# Patient Record
Sex: Female | Born: 1980 | Race: White | Hispanic: No | Marital: Married | Smoking: Former smoker
Health system: Southern US, Community
[De-identification: ages and names within clinical notes are randomized; demographics above are authoritative.]

## PROBLEM LIST (undated history)

## (undated) DIAGNOSIS — O24419 Gestational diabetes mellitus in pregnancy, unspecified control: Secondary | ICD-10-CM

## (undated) DIAGNOSIS — D649 Anemia, unspecified: Secondary | ICD-10-CM

## (undated) DIAGNOSIS — F319 Bipolar disorder, unspecified: Secondary | ICD-10-CM

## (undated) DIAGNOSIS — O09293 Supervision of pregnancy with other poor reproductive or obstetric history, third trimester: Secondary | ICD-10-CM

## (undated) HISTORY — PX: DILATION AND CURETTAGE OF UTERUS: SHX78

## (undated) HISTORY — PX: NOSE SURGERY: SHX723

## (undated) HISTORY — DX: Gestational diabetes mellitus in pregnancy, unspecified control: O24.419

## (undated) HISTORY — DX: Supervision of pregnancy with other poor reproductive or obstetric history, third trimester: O09.293

---

## 2007-12-14 ENCOUNTER — Emergency Department: Payer: Self-pay | Admitting: Emergency Medicine

## 2008-12-18 ENCOUNTER — Emergency Department: Payer: Self-pay

## 2009-01-11 ENCOUNTER — Encounter: Payer: Self-pay | Admitting: Maternal & Fetal Medicine

## 2009-02-15 ENCOUNTER — Encounter: Payer: Self-pay | Admitting: Obstetrics and Gynecology

## 2009-02-18 ENCOUNTER — Encounter: Payer: Self-pay | Admitting: Obstetrics and Gynecology

## 2009-04-07 ENCOUNTER — Observation Stay: Payer: Self-pay | Admitting: Obstetrics and Gynecology

## 2009-08-18 ENCOUNTER — Inpatient Hospital Stay: Payer: Self-pay

## 2009-10-15 ENCOUNTER — Emergency Department: Payer: Self-pay | Admitting: Emergency Medicine

## 2010-05-27 ENCOUNTER — Emergency Department: Payer: Self-pay | Admitting: Emergency Medicine

## 2011-07-22 ENCOUNTER — Emergency Department: Payer: Self-pay | Admitting: Internal Medicine

## 2011-07-22 LAB — RAPID INFLUENZA A&B ANTIGENS

## 2011-08-04 ENCOUNTER — Encounter: Payer: Self-pay | Admitting: Maternal & Fetal Medicine

## 2011-11-08 ENCOUNTER — Emergency Department: Payer: Self-pay | Admitting: *Deleted

## 2011-11-09 ENCOUNTER — Emergency Department: Payer: Self-pay | Admitting: *Deleted

## 2012-02-12 ENCOUNTER — Inpatient Hospital Stay: Payer: Self-pay | Admitting: Obstetrics and Gynecology

## 2012-02-12 LAB — CBC WITH DIFFERENTIAL/PLATELET
Basophil %: 0.3 %
Eosinophil #: 0.4 10*3/uL (ref 0.0–0.7)
Eosinophil %: 2.4 %
HCT: 33 % — ABNORMAL LOW (ref 35.0–47.0)
HGB: 11.5 g/dL — ABNORMAL LOW (ref 12.0–16.0)
Lymphocyte #: 4.1 10*3/uL — ABNORMAL HIGH (ref 1.0–3.6)
Lymphocyte %: 22.3 %
MCH: 31.7 pg (ref 26.0–34.0)
Monocyte #: 1.4 x10 3/mm — ABNORMAL HIGH (ref 0.2–0.9)
Neutrophil %: 67.5 %
Platelet: 205 10*3/uL (ref 150–440)
RBC: 3.63 10*6/uL — ABNORMAL LOW (ref 3.80–5.20)
WBC: 18.3 10*3/uL — ABNORMAL HIGH (ref 3.6–11.0)

## 2012-02-14 LAB — HEMOGLOBIN: HGB: 8.9 g/dL — ABNORMAL LOW (ref 12.0–16.0)

## 2012-02-23 ENCOUNTER — Emergency Department: Payer: Self-pay | Admitting: Emergency Medicine

## 2012-02-23 LAB — CBC
HCT: 33.3 % — ABNORMAL LOW (ref 35.0–47.0)
MCH: 33.1 pg (ref 26.0–34.0)
MCHC: 34.1 g/dL (ref 32.0–36.0)
MCV: 97 fL (ref 80–100)
RDW: 14.3 % (ref 11.5–14.5)
WBC: 11.8 10*3/uL — ABNORMAL HIGH (ref 3.6–11.0)

## 2012-02-23 LAB — COMPREHENSIVE METABOLIC PANEL
Alkaline Phosphatase: 90 U/L (ref 50–136)
BUN: 7 mg/dL (ref 7–18)
Bilirubin,Total: 0.4 mg/dL (ref 0.2–1.0)
Calcium, Total: 8.8 mg/dL (ref 8.5–10.1)
Chloride: 112 mmol/L — ABNORMAL HIGH (ref 98–107)
Creatinine: 0.66 mg/dL (ref 0.60–1.30)
EGFR (African American): >60
SGPT (ALT): 20 U/L (ref 12–78)
Total Protein: 7.5 g/dL (ref 6.4–8.2)

## 2012-02-23 LAB — PRO B NATRIURETIC PEPTIDE: B-Type Natriuretic Peptide: 139 pg/mL — ABNORMAL HIGH (ref 0–125)

## 2012-03-02 ENCOUNTER — Emergency Department: Payer: Self-pay | Admitting: Emergency Medicine

## 2012-03-13 ENCOUNTER — Emergency Department: Payer: Self-pay | Admitting: Emergency Medicine

## 2012-03-26 LAB — TROPONIN I: Troponin-I: <0.02 ng/mL

## 2012-03-26 LAB — COMPREHENSIVE METABOLIC PANEL
Anion Gap: 7 (ref 7–16)
Bilirubin,Total: 0.2 mg/dL (ref 0.2–1.0)
Calcium, Total: 8.8 mg/dL (ref 8.5–10.1)
Co2: 25 mmol/L (ref 21–32)
Creatinine: 0.78 mg/dL (ref 0.60–1.30)
EGFR (Non-African Amer.): >60
Osmolality: 279 (ref 275–301)
Potassium: 3.7 mmol/L (ref 3.5–5.1)
SGPT (ALT): 24 U/L (ref 12–78)
Sodium: 140 mmol/L (ref 136–145)

## 2012-03-26 LAB — PRO B NATRIURETIC PEPTIDE: B-Type Natriuretic Peptide: 9 pg/mL (ref 0–125)

## 2012-03-26 LAB — CBC
HGB: 12.7 g/dL (ref 12.0–16.0)
MCH: 29.4 pg (ref 26.0–34.0)
MCHC: 33.5 g/dL (ref 32.0–36.0)
Platelet: 219 10*3/uL (ref 150–440)
RBC: 4.33 10*6/uL (ref 3.80–5.20)
WBC: 18.5 10*3/uL — ABNORMAL HIGH (ref 3.6–11.0)

## 2012-03-26 LAB — CK TOTAL AND CKMB (NOT AT ARMC)
CK, Total: 137 U/L (ref 21–215)
CK-MB: 0.6 ng/mL (ref 0.5–3.6)

## 2012-03-27 ENCOUNTER — Inpatient Hospital Stay: Payer: Self-pay | Admitting: Internal Medicine

## 2012-03-28 LAB — CBC WITH DIFFERENTIAL/PLATELET
Basophil #: 0 10*3/uL (ref 0.0–0.1)
Eosinophil #: 0 10*3/uL (ref 0.0–0.7)
HCT: 37 % (ref 35.0–47.0)
Lymphocyte %: 8.7 %
MCHC: 32 g/dL (ref 32.0–36.0)
Monocyte %: 2.5 %
Platelet: 251 10*3/uL (ref 150–440)
RBC: 4.16 10*6/uL (ref 3.80–5.20)
RDW: 15.3 % — ABNORMAL HIGH (ref 11.5–14.5)
WBC: 30.5 10*3/uL — ABNORMAL HIGH (ref 3.6–11.0)

## 2012-03-28 LAB — CREATININE, SERUM
Creatinine: 0.99 mg/dL (ref 0.60–1.30)
EGFR (African American): >60
EGFR (Non-African Amer.): >60

## 2012-03-29 LAB — CBC WITH DIFFERENTIAL/PLATELET
Basophil %: 0.3 %
Eosinophil #: 0 10*3/uL (ref 0.0–0.7)
HGB: 12 g/dL (ref 12.0–16.0)
Lymphocyte %: 10 %
Monocyte %: 3.8 %
Neutrophil #: 20.7 10*3/uL — ABNORMAL HIGH (ref 1.4–6.5)
Platelet: 255 10*3/uL (ref 150–440)
RBC: 4.12 10*6/uL (ref 3.80–5.20)
RDW: 15.3 % — ABNORMAL HIGH (ref 11.5–14.5)
WBC: 24.1 10*3/uL — ABNORMAL HIGH (ref 3.6–11.0)

## 2013-10-27 ENCOUNTER — Emergency Department: Payer: Self-pay | Admitting: Emergency Medicine

## 2014-11-07 NOTE — H&P (Signed)
PATIENT NAME:  Casey Meyer, Casey Meyer MR#:  [id number] DATE OF BIRTH:  May 13, 1981  DATE OF ADMISSION:  03/27/2012  PRIMARY CARE PHYSICIAN: Casey Meyer has no primary care physician.   CHIEF COMPLAINT: Increased wheezing, shortness of breath, and cough x several weeks.   HISTORY OF PRESENT ILLNESS: Casey Meyer is 34 year old pleasant Caucasian female with history of asthma. The patient is post partum. Casey Meyer delivered her current baby 6 weeks ago. For the last couple of months Casey Meyer is having wheezing and shortness of breath. Casey Meyer was treated here at the emergency department with bronchodilator therapy, is using inhalers; and Casey Meyer was placed on prednisone tapering. The patient has no primary care physician to follow up and Casey Meyer comes to the emergency department. Casey Meyer ran out of her prednisone a few days ago, and her symptoms worsened. The patient reports worsening wheezing, cough with thick yellow to green sputum.  Casey Meyer denies having any fever. No chest pain. The patient was treated at the emergency department with bronchodilator therapy. However, Casey Meyer was having both inspiratory and expiratory wheezing and necessitated to be placed on BiPAP ventilation treatment. The patient is now being admitted to the hospital for further treatment.   REVIEW OF SYSTEMS: CONSTITUTIONAL: Denies any fever. No chills. Casey Meyer has mild fatigue. EYES: No blurring of vision. No double vision. ENT: No hearing impairment. No sore throat. No dysphagia. CARDIOVASCULAR: No chest pain but reports wheezing and shortness of breath. No syncope. RESPIRATORY: Shortness of breath and wheezing, cough with yellowish to greenish sputum. GASTROINTESTINAL: No abdominal pain. No vomiting. No diarrhea. GENITOURINARY: No dysuria. No frequency of urination. MUSCULOSKELETAL: No joint pain or swelling. No muscular pain or swelling. INTEGUMENTARY: No skin rash. No ulcers. NEUROLOGY: No focal weakness. No seizure activity. No headache. PSYCHIATRY: No anxiety. Casey Meyer has history of  bipolar depression. ENDOCRINE: No polyuria or polydipsia. No heat or cold intolerance.   PAST MEDICAL HISTORY: Asthma diagnosed a few months ago. Bipolar manic depressive illness.   PAST SURGICAL HISTORY: Septal deviation, underwent septoplasty.   FAMILY HISTORY: Casey Meyer has one aunt who has asthma. Her mother suffered from obstructive sleep apnea syndrome, and Casey Meyer died at sleep. Casey Meyer was 34 years old. The patient has no information about her father.   SOCIAL HABITS: Ex-chronic smoker. Casey Meyer used to smoke 10 cigarettes a day since the age of 34. Casey Meyer quit 9 months ago at the beginning of her pregnancy. No history of alcohol or other drug abuse.   SOCIAL HISTORY: Casey Meyer is married, living with her husband. Casey Meyer has 2 children. The last child was just newly born 6 weeks ago. The patient is unemployed. Casey Meyer stays at home.   ADMISSION MEDICATIONS:  ProAir HFA 2 inhalations q.4 hours p.r.n. Casey Meyer finished prednisone tapering recently.    ALLERGIES: No known drug allergies but Casey Meyer has local reaction to bee stings.   PHYSICAL EXAMINATION:  VITAL SIGNS: Blood pressure 126/85, respiratory rate 24, pulse 103, temperature 97.8, oxygen saturation 98%.   GENERAL APPEARANCE: Young female sitting at the bedside, appears short of breath; and Casey Meyer has the BiPAP face mask on receiving treatment.   HEAD/NECK: No pallor. No icterus. No cyanosis.   ENT: Hearing was normal. Nasal mucosa, lips, tongue were normal.   EYES: Normal iris and conjunctivae. Pupils about 7 mm, equal and reactive to light.   NECK: Supple. Trachea at midline. No thyromegaly. No cervical lymphadenopathy. No masses.   HEART: Normal S1, S2. No S3 or S4. No murmur. No gallop. No carotid bruits.   LUNGS:  The patient is using accessory muscles. Casey Meyer is slightly tachypneic. Casey Meyer has inspiratory and expiratory wheezing with slight prolongation of the expiratory phase. No rales.   ABDOMEN: Soft without tenderness. No hepatosplenomegaly. No masses. No  hernias.   SKIN: No ulcers. No subcutaneous nodules.   MUSCULOSKELETAL: No joint swelling. No clubbing.   NEUROLOGIC: Cranial nerves II through XII are intact. No focal motor deficit.   PSYCHIATRIC: The patient is alert and oriented x3. Mood and affect were normal.   LABORATORY FINDINGS: Chest x-ray showed no acute cardiopulmonary abnormality. EKG showed sinus tachycardia at rate of 114 per minute. Unremarkable EKG. B-type natriuretic peptide or BNP was 9, glucose 99, BUN 12, creatinine 0.7, sodium 140, potassium 3.7. Calcium 8.8. Normal liver function tests. Troponin less than 0.02. CBC showed white count of 18,000, hemoglobin 12, hematocrit 38, platelet count 219,000. Pregnancy test was negative. ABGs showed a pH of 7.47, pCO2 of 24, pO2 of 113; and that was on FiO2 of 40%. This is on BiPAP.   ASSESSMENT:  1. Acute asthmatic bronchitis.  2. Recent acute bronchitis probably precipitated the asthmatic attack or worsened the asthma.  3. Leukocytosis, most likely secondary to the bronchitis versus the stress of her presentation.  4. Patient is 6 weeks postpartum.     PLAN:  Will admit the patient to the medical floor. Continue BiPAP treatment and oxygen supplementation then taper oxygen off as possible. DuoNebs q.4 hours while awake. IV Solu-Medrol. I will also add Rocephin 1 gram daily for her bronchitis symptoms. I advised the patient to avoid smoking. Casey Meyer quit 9 months ago. I also spoke to the patient regarding the dust free environment at her home and the measures that Casey Meyer should take. I would like also to mention that the patient is not breastfeeding at the time being.   TIME SPENT EVALUATING THIS PATIENT: More than 50 minutes.     ____________________________ Carney Corners. Rudene Re, MD amd:vtd Meyer: 03/27/2012 01:02:06 ET T: 03/27/2012 08:17:41 ET JOB#: [id number]  cc: Carney Corners. Rudene Re, MD, <Dictator> Zollie Scale MD ELECTRONICALLY SIGNED 03/27/2012 22:37

## 2014-11-07 NOTE — Discharge Summary (Signed)
PATIENT NAME:  Casey Meyer, Casey Meyer MR#:  [id number] DATE OF BIRTH:  Aug 27, 1980  DATE OF ADMISSION:  03/27/2012 DATE OF DISCHARGE:  03/29/2012  DISCHARGE DIAGNOSES:  1. Status asthmaticus with shortness of breath and cough, resolved.  2. Acute bronchitis.   CONSULTATIONS: None.   DISCHARGE MEDICATIONS:  1. Acetaminophen with hydrocodone 5/325 every four hours as needed, 12 tablets are given because patient has pain in her back with coughing.  2. DuoNebs prescription every six hours as needed.  3. Tussionex 5 mL p.o. b.i.Meyer., 50 mL prescribed.  4. Montelukast 10 mg daily. 5. Levaquin 500 mg daily for 10 days. 6. Prednisone 60 mg daily for three days, 50 mg daily for three days, 40 mg daily for two days and 30 mg daily for two days, and 20 mg daily for two days, and 10 mg daily for two days as tapering dose.   HOSPITAL COURSE: Patient is a 34 year old female patient who has no past medical history who came in because of shortness of breath and wheezing. Patient was very hypoxic when she came. Her ABG showed pH of 7.47, CO2 24, pO2 113. She was on 40% FiO2 on BiPAP. Because of severe asthma she is admitted to telemetry, started on DuoNebs every four hours, Rocephin, Zithromax and IV fluids. Patient has history of smoking, but she quit nine months ago. Patient is admitted to hospitalist for status asthmaticus, continued on DuoNebs. Initially was on BiPAP but she was able to come off BiPAP but continuously required about 4 liters of oxygen initially and then her chest x-ray did not show any pneumonia. On admission her white count was elevated at 18.5 and her kidney function was normal. Troponins have been negative. BNP 9. Chest x-ray showed no acute disease of the chest. Patient continues to have wheezing and hypoxia requiring oxygen until yesterday and continued nebulizer and increased the dose of steroid. Patient continued on Rocephin, Zithromax, Solu-Medrol and she was maintained on Solu-Medrol at 80 q.6  hours and Singulair was started yesterday. Patient is doing much better today. She is off oxygen and oxygen saturation on room air 94%, afebrile. Initially yesterday white count was up to 30, but she had no fever and no sputum production when coughs today. White count dropped to 24.1. Patient's chest x-ray repeated again this morning showing mild basilar opacities likely secondary to atelectasis. Early infection would difficult to exclude but anyway she is going to be on antibiotics along with steroids and nebulizers and she can continue that and follow with her primary doctor. Patient has no primary doctor and we will set up with one of the local area doctors.   TIME SPENT ON DISCHARGE PREPARATION: More than 30 minutes.   ____________________________ Katha HammingSnehalatha Mianna Iezzi, MD sk:cms Meyer: 03/29/2012 13:13:07 ET T: 03/30/2012 12:00:02 ET JOB#: [id number]  cc: Katha HammingSnehalatha Koleson Reifsteck, MD, <Dictator> Katha HammingSNEHALATHA  Paone MD ELECTRONICALLY SIGNED 04/06/2012 16:16

## 2014-11-28 NOTE — H&P (Signed)
L&D Evaluation:  History:   HPI 34 y/o G2P1001 @ 38+wks edc 02/22/12 arrives with c/o contractions getting stronger and bloody mucus show, baby is active. Care @ [facility], obesity  HX bipolar disorder GBS negative    Presents with contractions    Patient's Medical History No Chronic Illness    Patient's Surgical History none    Medications Pre Serbiaatal Vitamins    Social History none    Family History Non-Contributory   ROS:   ROS All systems were reviewed.  HEENT, CNS, GI, GU, Respiratory, CV, Renal and Musculoskeletal systems were found to be normal.   Exam:   Vital Signs stable    Urine Protein not completed    General no apparent distress    Mental Status clear    Chest clear    Heart normal sinus rhythm    Abdomen gravid, tender with contractions    Estimated Fetal Weight Average for gestational age    Fetal Position vtx    Fundal Height term    Back no CVAT    Edema no edema    Reflexes 1+    Clonus negative    Pelvic no external lesions, 5cm 80% vtx @ -1 cx posterior BOWI nl show    Mebranes Intact    FHT normal rate with no decels, baseline 130's 140's avg variabilty with accels    Fetal Heart Rate 136    Ucx irregular, EFM reapplied after ambulating    Skin dry    Lymph no lymphadenopathy   Impression:   Impression early labor   Plan:   Plan EFM/NST, monitor contractions and for cervical change    Comments Admitted, knows what to expect 2nd baby. Family supportive at bedside. Plans epidural with labor progress.   Electronic Signatures: Albertina ParrLugiano, Keighley Deckman B (CNM)  (Signed 25-Jul-13 19:54)  Authored: L&D Evaluation   Last Updated: 25-Jul-13 19:54 by Albertina ParrLugiano, Eliasar Hlavaty B (CNM)

## 2015-05-20 ENCOUNTER — Emergency Department
Admission: EM | Admit: 2015-05-20 | Discharge: 2015-05-20 | Disposition: A | Discharge status: Home / Self Care | Payer: Self-pay | Attending: Student | Admitting: Student

## 2015-05-20 ENCOUNTER — Emergency Department: Payer: Self-pay

## 2015-05-20 DIAGNOSIS — M533 Sacrococcygeal disorders, not elsewhere classified: Secondary | ICD-10-CM | POA: Insufficient documentation

## 2015-05-20 DIAGNOSIS — Z3202 Encounter for pregnancy test, result negative: Secondary | ICD-10-CM | POA: Insufficient documentation

## 2015-05-20 DIAGNOSIS — G8929 Other chronic pain: Secondary | ICD-10-CM | POA: Insufficient documentation

## 2015-05-20 LAB — POCT PREGNANCY, URINE: Preg Test, Ur: NEGATIVE

## 2015-05-20 MED ORDER — KETOROLAC TROMETHAMINE 60 MG/2ML IM SOLN
60.0000 mg | Freq: Once | INTRAMUSCULAR | Status: AC
  Administered 2015-05-20: 60 mg via INTRAMUSCULAR
  Filled 2015-05-20: qty 2

## 2015-05-20 MED ORDER — HYDROCODONE-ACETAMINOPHEN 5-325 MG PO TABS
2.0000 | ORAL_TABLET | Freq: Once | ORAL | Status: AC
  Administered 2015-05-20: 2 via ORAL
  Filled 2015-05-20: qty 2

## 2015-05-20 MED ORDER — KETOROLAC TROMETHAMINE 10 MG PO TABS: 10.0000 mg | ORAL_TABLET | Freq: Four times a day (QID) | ORAL | Status: DC | PRN

## 2015-05-20 NOTE — ED Notes (Signed)
Back pain for months . Increased pain no new injury

## 2015-05-20 NOTE — ED Provider Notes (Signed)
[facility] Emergency Department Provider Note  ____________________________________________  Time seen: Approximately 5:17 PM  I have reviewed the triage vital signs and the nursing notes.   HISTORY  Chief Complaint Back Pain    HPI Casey Meyer is a 34 y.o. female who presents with a history of tailbone pain for years. Patient states that she's got pain radiating down both her legs now with numbness on her feet. Denies any saddle numbness. Ongoing for months.   No past medical history on file.  There are no active problems to display for this patient.   No past surgical history on file.  Current Outpatient Rx  Name  Route  Sig  Dispense  Refill  . ketorolac (TORADOL) 10 MG tablet   Oral   Take 1 tablet (10 mg total) by mouth every 6 (six) hours as needed.   20 tablet   0     Allergies Bee venom  No family history on file.  Social History Social History  Substance Use Topics  . Smoking status: Not on file  . Smokeless tobacco: Not on file  . Alcohol Use: Not on file    Review of Systems Constitutional: No fever/chills Eyes: No visual changes. ENT: No sore throat. Cardiovascular: Denies chest pain. Respiratory: Denies shortness of breath. Gastrointestinal: No abdominal pain.  No nausea, no vomiting.  No diarrhea.  No constipation. Genitourinary: Negative for dysuria. Musculoskeletal: Positive for low back and tailbone pain. Skin: Negative for rash. Neurological: Negative for headaches, focal weakness or numbness.  10-point ROS otherwise negative.  ____________________________________________   PHYSICAL EXAM:  VITAL SIGNS: ED Triage Vitals  Enc Vitals Group     BP 05/20/15 1702 127/66 mmHg     Pulse Rate 05/20/15 1702 83     Resp 05/20/15 1702 18     Temp 05/20/15 1702 98.4 F (36.9 C)     Temp Source 05/20/15 1702 Oral     SpO2 05/20/15 1702 97 %     Weight 05/20/15 1702 164 lb (74.39 kg)     Height 05/20/15 1702   (2.083 m)     Head Cir --      Peak Flow --      Pain Score 05/20/15 1705 7     Pain Loc --      Pain Edu? --      Excl. in GC? --     Constitutional: Alert and oriented. Well appearing and in no acute distress..   Cardiovascular: Normal rate, regular rhythm. Grossly normal heart sounds.  Good peripheral circulation. Respiratory: Normal respiratory effort.  No retractions. Lungs CTAB. Gastrointestinal: Soft and nontender. No distention. No abdominal bruits. No CVA tenderness. Musculoskeletal: No lower extremity tenderness nor edema.  No joint effusions. Point tenderness to the sacrum coccyx area. Distally neurovascularly intact. Neurologic:  Normal speech and language. No gross focal neurologic deficits are appreciated. No gait instability. Skin:  Skin is warm, dry and intact. No rash noted. Psychiatric: Mood and affect are normal. Speech and behavior are normal.  ____________________________________________   LABS (all labs ordered are listed, but only abnormal results are displayed)  Labs Reviewed  POCT PREGNANCY, URINE  POC URINE PREG, ED   ____________________________________________  RADIOLOGY  Nothing acute. ____________________________________________   PROCEDURES  Procedure(s) performed: None  Critical Care performed: No  ____________________________________________   INITIAL IMPRESSION / ASSESSMENT AND PLAN / ED COURSE  Pertinent labs & imaging results that were available during my care of the patient were reviewed  by me and considered in my medical decision making (see chart for details).  Chronic tailbone pain. Patient encouraged to follow-up with orthopedics as directed or establish local family practice provider to get a lumbar MRI if needed. Patient voices understanding and offers no other emergency medical complaints at this time. ____________________________________________   FINAL CLINICAL IMPRESSION(S) / ED DIAGNOSES  Final diagnoses:   Coccygeal pain, chronic      Evangeline DakinCharles M Okie Bogacz, PA-C 05/20/15 2033  Gayla DossEryka A Gayle, MD 05/20/15 [phone]

## 2015-05-20 NOTE — Discharge Instructions (Signed)
Tailbone Injury °The tailbone (coccyx) is the small bone at the lower end of the spine. A tailbone injury may involve stretched ligaments, bruising, or a broken bone (fracture). Tailbone injuries can be painful, and some may take a long time to heal. °CAUSES °This condition is often caused by falling and landing on the tailbone. Other causes include: °· Repeated strain or friction from actions such as rowing and bicycling. °· Childbirth. °In some cases, the cause may not be known. °RISK FACTORS °This condition is more common in women than in men. °SYMPTOMS °Symptoms of this condition include: °· Pain in the lower back, especially when sitting. °· Pain or difficulty when standing up from a sitting position. °· Bruising in the tailbone area. °· Painful bowel movements. °· In women, pain during intercourse. °DIAGNOSIS °This condition may be diagnosed based on your symptoms and a physical exam. X-rays may be taken if a fracture is suspected. You may also have other tests, such as a CT scan or MRI. °TREATMENT °This condition may be treated with medicines to help relieve your pain. Most tailbone injuries heal on their own in 4-6 weeks. However, recovery time may be longer if the injury involves a fracture. °HOME CARE INSTRUCTIONS °· Take medicines only as directed by your health care provider. °· If directed, apply ice to the injured area: °¨ Put ice in a plastic bag. °¨ Place a towel between your skin and the bag. °¨ Leave the ice on for 20 minutes, 2-3 times per day for the first 1-2 days. °· Sit on a large, rubber or inflated ring or cushion to ease your pain. Lean forward when you are sitting to help decrease discomfort. °· Avoid sitting for long periods of time. °· Increase your activity as the pain allows. Perform any exercises that are recommended by your health care provider or physical therapist. °· If you have pain during bowel movements, use stool softeners as directed by your health care provider. °· Eat a  diet that includes plenty of fiber to help prevent constipation. °· Keep all follow-up visits as directed by your health care provider. This is important. °PREVENTION °Wear appropriate padding and sports gear when bicycling and rowing. This can help to prevent developing an injury that is caused by repeated strain or friction. °SEEK MEDICAL CARE IF: °· Your pain becomes worse. °· Your bowel movements cause a great deal of discomfort. °· You are unable to have a bowel movement. °· You have uncontrolled urine loss (urinary incontinence). °· You have a fever. °  °This information is not intended to replace advice given to you by your health care provider. Make sure you discuss any questions you have with your health care provider. °  °Document Released: 07/04/2000 Document Revised: 11/21/2014 Document Reviewed: 07/03/2014 °Elsevier Interactive Patient Education ©2016 [employer] ° °

## 2015-05-20 NOTE — ED Notes (Signed)
Pt states hx of tailbone pain for years, states recently now she has nerve pain radiating down her legs, pt ambulatory to room

## 2015-07-08 ENCOUNTER — Emergency Department
Admission: EM | Admit: 2015-07-08 | Discharge: 2015-07-08 | Disposition: A | Discharge status: Home / Self Care | Payer: Self-pay | Attending: Emergency Medicine | Admitting: Emergency Medicine

## 2015-07-08 ENCOUNTER — Encounter: Payer: Self-pay | Admitting: Emergency Medicine

## 2015-07-08 DIAGNOSIS — R112 Nausea with vomiting, unspecified: Secondary | ICD-10-CM | POA: Insufficient documentation

## 2015-07-08 DIAGNOSIS — R109 Unspecified abdominal pain: Secondary | ICD-10-CM | POA: Insufficient documentation

## 2015-07-08 DIAGNOSIS — R197 Diarrhea, unspecified: Secondary | ICD-10-CM | POA: Insufficient documentation

## 2015-07-08 DIAGNOSIS — Z3202 Encounter for pregnancy test, result negative: Secondary | ICD-10-CM | POA: Insufficient documentation

## 2015-07-08 DIAGNOSIS — F17200 Nicotine dependence, unspecified, uncomplicated: Secondary | ICD-10-CM | POA: Insufficient documentation

## 2015-07-08 DIAGNOSIS — R630 Anorexia: Secondary | ICD-10-CM | POA: Insufficient documentation

## 2015-07-08 LAB — COMPREHENSIVE METABOLIC PANEL
ALBUMIN: 4.5 g/dL (ref 3.5–5.0)
ALK PHOS: 49 U/L (ref 38–126)
ALT: 24 U/L (ref 14–54)
AST: 21 U/L (ref 15–41)
Anion gap: 7 (ref 5–15)
BUN: 7 mg/dL (ref 6–20)
CALCIUM: 9.3 mg/dL (ref 8.9–10.3)
CHLORIDE: 104 mmol/L (ref 101–111)
CO2: 27 mmol/L (ref 22–32)
CREATININE: 0.78 mg/dL (ref 0.44–1.00)
GFR calc non Af Amer: >60 mL/min (ref >60)
GLUCOSE: 129 mg/dL — AB (ref 65–99)
Potassium: 3 mmol/L — ABNORMAL LOW (ref 3.5–5.1)
SODIUM: 138 mmol/L (ref 135–145)
Total Bilirubin: 0.8 mg/dL (ref 0.3–1.2)
Total Protein: 7.4 g/dL (ref 6.5–8.1)

## 2015-07-08 LAB — URINALYSIS COMPLETE WITH MICROSCOPIC (ARMC ONLY)
BILIRUBIN URINE: NEGATIVE
Bacteria, UA: NONE SEEN
Glucose, UA: NEGATIVE mg/dL
Hgb urine dipstick: NEGATIVE
Ketones, ur: NEGATIVE mg/dL
Leukocytes, UA: NEGATIVE
Nitrite: NEGATIVE
PH: 5 (ref 5.0–8.0)
Protein, ur: 30 mg/dL — AB
Specific Gravity, Urine: 1.033 — ABNORMAL HIGH (ref 1.005–1.030)

## 2015-07-08 LAB — CBC
HCT: 41.2 % (ref 35.0–47.0)
HEMOGLOBIN: 13.5 g/dL (ref 12.0–16.0)
MCH: 29.7 pg (ref 26.0–34.0)
MCHC: 32.9 g/dL (ref 32.0–36.0)
MCV: 90.4 fL (ref 80.0–100.0)
PLATELETS: 178 10*3/uL (ref 150–440)
RBC: 4.56 MIL/uL (ref 3.80–5.20)
RDW: 13.3 % (ref 11.5–14.5)
WBC: 11.1 10*3/uL — ABNORMAL HIGH (ref 3.6–11.0)

## 2015-07-08 LAB — POCT PREGNANCY, URINE: Preg Test, Ur: NEGATIVE

## 2015-07-08 LAB — LIPASE, BLOOD: LIPASE: 15 U/L (ref 11–51)

## 2015-07-08 MED ORDER — ONDANSETRON HCL 4 MG/2ML IJ SOLN
4.0000 mg | Freq: Once | INTRAMUSCULAR | Status: AC
  Administered 2015-07-08: 4 mg via INTRAVENOUS
  Filled 2015-07-08: qty 2

## 2015-07-08 MED ORDER — METOCLOPRAMIDE HCL 5 MG/ML IJ SOLN
10.0000 mg | Freq: Once | INTRAMUSCULAR | Status: AC
  Administered 2015-07-08: 10 mg via INTRAVENOUS
  Filled 2015-07-08: qty 2

## 2015-07-08 MED ORDER — METOCLOPRAMIDE HCL 5 MG PO TABS: 5.0000 mg | ORAL_TABLET | Freq: Three times a day (TID) | ORAL | Status: DC

## 2015-07-08 MED ORDER — PROMETHAZINE HCL 12.5 MG PO TABS: 12.5000 mg | ORAL_TABLET | Freq: Four times a day (QID) | ORAL | Status: DC | PRN

## 2015-07-08 MED ORDER — DIPHENHYDRAMINE HCL 50 MG/ML IJ SOLN
12.5000 mg | Freq: Once | INTRAMUSCULAR | Status: AC
  Administered 2015-07-08: 12.5 mg via INTRAVENOUS
  Filled 2015-07-08: qty 1

## 2015-07-08 MED ORDER — DEXTROSE-NACL 5-0.9 % IV SOLN
1000.0000 mL | Freq: Once | INTRAVENOUS | Status: AC
  Administered 2015-07-08: 1000 mL via INTRAVENOUS
  Filled 2015-07-08: qty 1000

## 2015-07-08 NOTE — ED Provider Notes (Signed)
[facility] Emergency Department Provider Note  ____________________________________________  Time seen: 1245  I have reviewed the triage vital signs and the nursing notes.  History by:    HISTORY  Chief Complaint Nausea anorexia    HPI Casey Meyer is a 34 y.o. female who reports she developed a "stomach bug" proximally 10 days ago. She had nausea, vomiting, and diarrhea for 2 days. The symptoms resolved and she thought she was doing better, but when she attempted to eat pizza and other foods, she did not tolerate it well and she threw up again. Since then she has not been able to eat anything without feeling nauseous and having some abdominal pain. She reports she has lost 19 pounds in the past 10 days. She denies any bowel movement in the past 6 or 7 days. There has been no diarrhea recently. She denies any abdominal pain at this time.   History reviewed. No pertinent past medical history.  There are no active problems to display for this patient.   Past Surgical History  Procedure Laterality Date  . Nose surgery      Current Outpatient Rx  Name  Route  Sig  Dispense  Refill  . ketorolac (TORADOL) 10 MG tablet   Oral   Take 1 tablet (10 mg total) by mouth every 6 (six) hours as needed.   20 tablet   0   . metoCLOPramide (REGLAN) 5 MG tablet   Oral   Take 1 tablet (5 mg total) by mouth 3 (three) times daily.   15 tablet   0   . promethazine (PHENERGAN) 12.5 MG tablet   Oral   Take 1 tablet (12.5 mg total) by mouth every 6 (six) hours as needed for nausea or vomiting.   12 tablet   0     Allergies Bee venom  Family History  Problem Relation Age of Onset  . Cancer Father     Social History Social History  Substance Use Topics  . Smoking status: Current Every Day Smoker  . Smokeless tobacco: None  . Alcohol Use: No    Review of Systems  Constitutional: Negative for fever/chills. ENT: Negative for  congestion. Cardiovascular: Negative for chest pain. Respiratory: Negative for cough. Gastrointestinal: positive for abdominal pain, vomiting and diarrhea.see history of present illness Genitourinary: Negative for dysuria. Musculoskeletal: No back pain. Skin: Negative for rash. Neurological: Negative for headache or focal weakness   10-point ROS otherwise negative.  ____________________________________________   PHYSICAL EXAM:  VITAL SIGNS: ED Triage Vitals  Enc Vitals Group     BP 07/08/15 0811 133/98 mmHg     Pulse Rate 07/08/15 0811 77     Resp 07/08/15 0811 18     Temp 07/08/15 0811 98.1 F (36.7 C)     Temp Source 07/08/15 0811 Oral     SpO2 07/08/15 0811 98 %     Weight 07/08/15 0811 150 lb (68.04 kg)     Height 07/08/15 0811  (1.575 m)     Head Cir --      Peak Flow --      Pain Score --      Pain Loc --      Pain Edu? --      Excl. in GC? --    Constitutional:  Alert and oriented. Overall well appearing and in no distress. ENT   Head: Normocephalic and atraumatic.   Nose: No congestion/rhinnorhea.       Mouth: No erythema, no  swelling   Cardiovascular: Normal rate, regular rhythm, no murmur noted Respiratory:  Normal respiratory effort, no tachypnea.    Breath sounds are clear and equal bilaterally.  Gastrointestinal: Soft, no distention. Nontender Back: No muscle spasm, no tenderness, no CVA tenderness. Musculoskeletal: No deformity noted. Nontender with normal range of motion in all extremities.  No noted edema. Neurologic:  Communicative. Equal grip strength bilaterally. Little bit of sway with Romberg. Negative pronator drift. Good finger to nose coordination.Normal appearing spontaneous movement in all 4 extremities. No gross focal neurologic deficits are appreciated.  Skin:  Skin is warm, dry. No rash noted. Psychiatric: Mood and affect are normal. Speech and behavior are normal.  ____________________________________________    LABS  (pertinent positives/negatives)  Labs Reviewed  COMPREHENSIVE METABOLIC PANEL - Abnormal; Notable for the following:    Potassium 3.0 (*)    Glucose, Bld 129 (*)    All other components within normal limits  CBC - Abnormal; Notable for the following:    WBC 11.1 (*)    All other components within normal limits  URINALYSIS COMPLETEWITH MICROSCOPIC ([facility] ONLY) - Abnormal; Notable for the following:    Color, Urine YELLOW (*)    APPearance TURBID (*)    Specific Gravity, Urine 1.033 (*)    Protein, ur 30 (*)    Squamous Epithelial / LPF 0-5 (*)    All other components within normal limits  LIPASE, BLOOD  POC URINE PREG, ED  POCT PREGNANCY, URINE     ____________________________________________   INITIAL IMPRESSION / ASSESSMENT AND PLAN / ED COURSE  Pertinent labs & imaging results that were available during my care of the patient were reviewed by me and considered in my medical decision making (see chart for details).  Overall well-appearing, though nervous, [age] old female with signs and symptoms of a "stomach bug" 8-10 days ago. Her symptoms of overall resolved, she has not had any diarrhea. She merely complains of decreased appetite and inability to tolerate food. She specifically speaks about difficulty when she ate a pizza and when she ate taco's. She reports not eating anything else for nearly a week.  The patient's lab tests overall look good. She does have a slightly low potassium level at 3.0. Her urine looks good with no ketones. Despite no ketones, given her recent anorexia, I will treated with D5 normal saline and Zofran. I have added diphenhydramine and Reglan as well. We'll begin a by mouth challenge and see how this is tolerated.  ----------------------------------------- 2:05 PM on 07/08/2015 -----------------------------------------  Patient is feeling better. She is tolerating food and liquid by mouth. We will discharge her with a prescription for Phenergan as well  as Reglan to give her some options. I have asked her to follow up with her primary physician [facility]. I have counseled surgery return the emergency Department if she has further urgent concerns. ____________________________________________   FINAL CLINICAL IMPRESSION(S) / ED DIAGNOSES  Final diagnoses:  Anorexia  Nausea vomiting and diarrhea      Darien Ramusavid W Emmamarie Kluender, MD 07/08/15 1409

## 2015-07-08 NOTE — ED Notes (Addendum)
Pt presents to ER with recurrent nausea with loss of appetite since Friday. Pt had "stomach virus" in which she did not see anyone, but says she has lost 19 lbs in 9 days due to nausea symptoms. Pt states at times she has "sudden bursts of chills". "I just don't feel right".

## 2015-07-08 NOTE — ED Notes (Signed)
Pt states vomiting since last Friday (10 days) the diarrhea went away. States "hasn't eaten since Monday". Has not seen pcp for these symptoms. A&o x 3. vss

## 2015-07-08 NOTE — Discharge Instructions (Signed)
Your blood tests and urine tests overall look good. He felt better after Reglan and a little bit of Benadryl. He may take Phenergan for nausea or continue with Reglan. Try taking Reglan, 5 mg, 30 minutes before a light meal. Avoid foods that might irritate your intestinal tract. Eat simple bland foods and smaller quantities for a day or 2. If he continued to have difficulty with nausea, vomiting, and are unable to eat, or have other urgent concernsreturn to the emergency department. Follow-up at [facility] for any other nonemergent ongoing concerns.  Nausea and Vomiting Nausea means you feel sick to your stomach. Throwing up (vomiting) is a reflex where stomach contents come out of your mouth. HOME CARE   Take medicine as told by your doctor.  Do not force yourself to eat. However, you do need to drink fluids.  If you feel like eating, eat a normal diet as told by your doctor.  Eat rice, wheat, potatoes, bread, lean meats, yogurt, fruits, and vegetables.  Avoid high-fat foods.  Drink enough fluids to keep your pee (urine) clear or pale yellow.  Ask your doctor how to replace body fluid losses (rehydrate). Signs of body fluid loss (dehydration) include:  Feeling very thirsty.  Dry lips and mouth.  Feeling dizzy.  Dark pee.  Peeing less than normal.  Feeling confused.  Fast breathing or heart rate. GET HELP RIGHT AWAY IF:   You have blood in your throw up.  You have black or bloody poop (stool).  You have a bad headache or stiff neck.  You feel confused.  You have bad belly (abdominal) pain.  You have chest pain or trouble breathing.  You do not pee at least once every 8 hours.  You have cold, clammy skin.  You keep throwing up after 24 to 48 hours.  You have a fever. MAKE SURE YOU:   Understand these instructions.  Will watch your condition.  Will get help right away if you are not doing well or get worse.   This information is not intended to replace  advice given to you by your health care provider. Make sure you discuss any questions you have with your health care provider.   Document Released: 12/24/2007 Document Revised: 09/29/2011 Document Reviewed: 12/06/2010 Elsevier Interactive Patient Education [employer]2016 Elsevier Inc.

## 2015-07-08 NOTE — ED Notes (Signed)
Pt kept down crackers and ginger ale that were given for PO challenge.

## 2015-10-13 ENCOUNTER — Emergency Department
Admission: EM | Admit: 2015-10-13 | Discharge: 2015-10-13 | Disposition: A | Discharge status: Home / Self Care | Payer: Self-pay | Attending: Emergency Medicine | Admitting: Emergency Medicine

## 2015-10-13 ENCOUNTER — Encounter: Payer: Self-pay | Admitting: Emergency Medicine

## 2015-10-13 DIAGNOSIS — F17200 Nicotine dependence, unspecified, uncomplicated: Secondary | ICD-10-CM | POA: Insufficient documentation

## 2015-10-13 DIAGNOSIS — Z79899 Other long term (current) drug therapy: Secondary | ICD-10-CM | POA: Insufficient documentation

## 2015-10-13 DIAGNOSIS — A084 Viral intestinal infection, unspecified: Secondary | ICD-10-CM | POA: Insufficient documentation

## 2015-10-13 LAB — URINALYSIS COMPLETE WITH MICROSCOPIC (ARMC ONLY)
BILIRUBIN URINE: NEGATIVE
Bacteria, UA: NONE SEEN
Glucose, UA: NEGATIVE mg/dL
HGB URINE DIPSTICK: NEGATIVE
Leukocytes, UA: NEGATIVE
NITRITE: NEGATIVE
PH: 5 (ref 5.0–8.0)
PROTEIN: 30 mg/dL — AB
Specific Gravity, Urine: 1.029 (ref 1.005–1.030)

## 2015-10-13 LAB — CBC
HCT: 39.5 % (ref 35.0–47.0)
Hemoglobin: 13.4 g/dL (ref 12.0–16.0)
MCH: 30.5 pg (ref 26.0–34.0)
MCHC: 33.9 g/dL (ref 32.0–36.0)
MCV: 89.8 fL (ref 80.0–100.0)
PLATELETS: 195 10*3/uL (ref 150–440)
RBC: 4.4 MIL/uL (ref 3.80–5.20)
RDW: 13.6 % (ref 11.5–14.5)
WBC: 12.9 10*3/uL — ABNORMAL HIGH (ref 3.6–11.0)

## 2015-10-13 LAB — COMPREHENSIVE METABOLIC PANEL
ALBUMIN: 4.7 g/dL (ref 3.5–5.0)
ALK PHOS: 45 U/L (ref 38–126)
ALT: 21 U/L (ref 14–54)
ANION GAP: 8 (ref 5–15)
AST: 22 U/L (ref 15–41)
BILIRUBIN TOTAL: 0.8 mg/dL (ref 0.3–1.2)
BUN: 9 mg/dL (ref 6–20)
CALCIUM: 9.6 mg/dL (ref 8.9–10.3)
CO2: 23 mmol/L (ref 22–32)
CREATININE: 0.66 mg/dL (ref 0.44–1.00)
Chloride: 109 mmol/L (ref 101–111)
GFR calc Af Amer: >60 mL/min (ref >60)
GFR calc non Af Amer: >60 mL/min (ref >60)
GLUCOSE: 93 mg/dL (ref 65–99)
Potassium: 2.9 mmol/L — CL (ref 3.5–5.1)
Sodium: 140 mmol/L (ref 135–145)
TOTAL PROTEIN: 7.6 g/dL (ref 6.5–8.1)

## 2015-10-13 LAB — LIPASE, BLOOD: Lipase: 15 U/L (ref 11–51)

## 2015-10-13 LAB — POCT PREGNANCY, URINE: Preg Test, Ur: NEGATIVE

## 2015-10-13 MED ORDER — DICYCLOMINE HCL 20 MG PO TABS: 20.0000 mg | ORAL_TABLET | Freq: Three times a day (TID) | ORAL | Status: DC | PRN

## 2015-10-13 MED ORDER — SODIUM CHLORIDE 0.9 % IV SOLN
Freq: Once | INTRAVENOUS | Status: AC
  Administered 2015-10-13: 1000 mL via INTRAVENOUS

## 2015-10-13 MED ORDER — PROMETHAZINE HCL 12.5 MG RE SUPP: 12.5000 mg | Freq: Four times a day (QID) | RECTAL | Status: DC | PRN

## 2015-10-13 MED ORDER — POTASSIUM CHLORIDE ER 10 MEQ PO TBCR: 10.0000 meq | EXTENDED_RELEASE_TABLET | Freq: Two times a day (BID) | ORAL | Status: DC

## 2015-10-13 MED ORDER — PROMETHAZINE HCL 12.5 MG PO TABS: 12.5000 mg | ORAL_TABLET | Freq: Four times a day (QID) | ORAL | Status: DC | PRN

## 2015-10-13 MED ORDER — ONDANSETRON HCL 4 MG/2ML IJ SOLN
4.0000 mg | Freq: Once | INTRAMUSCULAR | Status: AC | PRN
  Administered 2015-10-13: 4 mg via INTRAVENOUS
  Filled 2015-10-13: qty 2

## 2015-10-13 NOTE — ED Notes (Signed)
Pt to ed with c/o vomiting and diarrhea x several months,  States today vomited x 8, also reports diarrhea today x 5.

## 2015-10-13 NOTE — Discharge Instructions (Signed)
Viral Gastroenteritis °Viral gastroenteritis is also known as stomach flu. This condition affects the stomach and intestinal tract. It can cause sudden diarrhea and vomiting. The illness typically lasts 3 to 8 days. Most people develop an immune response that eventually gets rid of the virus. While this natural response develops, the virus can make you quite ill. °CAUSES  °Many different viruses can cause gastroenteritis, such as rotavirus or noroviruses. You can catch one of these viruses by consuming contaminated food or water. You may also catch a virus by sharing utensils or other personal items with an infected person or by touching a contaminated surface. °SYMPTOMS  °The most common symptoms are diarrhea and vomiting. These problems can cause a severe loss of body fluids (dehydration) and a body salt (electrolyte) imbalance. Other symptoms may include: °· Fever. °· Headache. °· Fatigue. °· Abdominal pain. °DIAGNOSIS  °Your caregiver can usually diagnose viral gastroenteritis based on your symptoms and a physical exam. A stool sample may also be taken to test for the presence of viruses or other infections. °TREATMENT  °This illness typically goes away on its own. Treatments are aimed at rehydration. The most serious cases of viral gastroenteritis involve vomiting so severely that you are not able to keep fluids down. In these cases, fluids must be given through an intravenous line (IV). °HOME CARE INSTRUCTIONS  °· Drink enough fluids to keep your urine clear or pale yellow. Drink small amounts of fluids frequently and increase the amounts as tolerated. °· Ask your caregiver for specific rehydration instructions. °· Avoid: °¨ Foods high in sugar. °¨ Alcohol. °¨ Carbonated drinks. °¨ Tobacco. °¨ Juice. °¨ Caffeine drinks. °¨ Extremely hot or cold fluids. °¨ Fatty, greasy foods. °¨ Too much intake of anything at one time. °¨ Dairy products until 24 to 48 hours after diarrhea stops. °· You may consume probiotics.  Probiotics are active cultures of beneficial bacteria. They may lessen the amount and number of diarrheal stools in adults. Probiotics can be found in yogurt with active cultures and in supplements. °· Wash your hands well to avoid spreading the virus. °· Only take over-the-counter or prescription medicines for pain, discomfort, or fever as directed by your caregiver. Do not give aspirin to children. Antidiarrheal medicines are not recommended. °· Ask your caregiver if you should continue to take your regular prescribed and over-the-counter medicines. °· Keep all follow-up appointments as directed by your caregiver. °SEEK IMMEDIATE MEDICAL CARE IF:  °· You are unable to keep fluids down. °· You do not urinate at least once every 6 to 8 hours. °· You develop shortness of breath. °· You notice blood in your stool or vomit. This may look like coffee grounds. °· You have abdominal pain that increases or is concentrated in one small area (localized). °· You have persistent vomiting or diarrhea. °· You have a fever. °· The patient is a child younger than 3 months, and he or she has a fever. °· The patient is a child older than 3 months, and he or she has a fever and persistent symptoms. °· The patient is a child older than 3 months, and he or she has a fever and symptoms suddenly get worse. °· The patient is a baby, and he or she has no tears when crying. °MAKE SURE YOU:  °· Understand these instructions. °· Will watch your condition. °· Will get help right away if you are not doing well or get worse. °  °This information is not intended to replace   advice given to you by your health care provider. Make sure you discuss any questions you have with your health care provider. °  °Document Released: 07/07/2005 Document Revised: 09/29/2011 Document Reviewed: 04/23/2011 °Elsevier Interactive Patient Education ©2016 Elsevier Inc. ° °Please return immediately if condition worsens. Please contact her primary physician or the  physician you were given for referral. If you have any specialist physicians involved in her treatment and plan please also contact them. Thank you for using [facility]. ° °

## 2015-10-13 NOTE — ED Notes (Signed)
Pt reports n/v for "the last few months", denies pain.  Pt sts that she has been here for this issue before and was D/C w/ phenergan.  Pt sts medication works for n/v but she has run.  Pt denies being here for refill.  Pt sts she wants "to know whats going on".  Pt sts that she has been here before for this problem but has not followed up w/ PCP.

## 2015-10-14 NOTE — ED Provider Notes (Signed)
Time Seen: Approximately 2200  I have reviewed the triage notes  Chief Complaint: Nausea and Emesis   History of Present Illness: Casey Meyer is a 35 y.o. female who presents with nausea and vomiting is been occurring now intermittently for the last "" few months "". She states she gets occasional left lower quadrant abdominal pain. She states she has been evaluated here previously and was discharged with Phenergan which seemed to help her with her nausea and vomiting. Patient states that she also does develop some bouts weight loss over the past 2 months. She states she had some loose watery stool today with approximately 5 bowel movements. She goes on to state that her bowels have never been regular. She states that there's been no melena or hematochezia. She denies any hematemesis or biliary emesis.   History reviewed. No pertinent past medical history.  There are no active problems to display for this patient.   Past Surgical History  Procedure Laterality Date  . Nose surgery      Past Surgical History  Procedure Laterality Date  . Nose surgery      Current Outpatient Rx  Name  Route  Sig  Dispense  Refill  . dicyclomine (BENTYL) 20 MG tablet   Oral   Take 1 tablet (20 mg total) by mouth 3 (three) times daily as needed for spasms.   30 tablet   0   . ketorolac (TORADOL) 10 MG tablet   Oral   Take 1 tablet (10 mg total) by mouth every 6 (six) hours as needed.   20 tablet   0   . metoCLOPramide (REGLAN) 5 MG tablet   Oral   Take 1 tablet (5 mg total) by mouth 3 (three) times daily.   15 tablet   0   . potassium chloride (K-DUR) 10 MEQ tablet   Oral   Take 1 tablet (10 mEq total) by mouth 2 (two) times daily.   7 tablet   0   . promethazine (PHENERGAN) 12.5 MG suppository   Rectal   Place 1 suppository (12.5 mg total) rectally every 6 (six) hours as needed for nausea or vomiting.   12 each   0   . promethazine (PHENERGAN) 12.5 MG tablet   Oral   Take  1 tablet (12.5 mg total) by mouth every 6 (six) hours as needed for nausea or vomiting.   30 tablet   0     Allergies:  Bee venom  Family History: Family History  Problem Relation Age of Onset  . Cancer Father     Social History: Social History  Substance Use Topics  . Smoking status: Current Every Day Smoker  . Smokeless tobacco: None  . Alcohol Use: No     Review of Systems:   10 point review of systems was performed and was otherwise negative:  Constitutional: No fever. She states she's lost 40 pounds over the last several months Eyes: No visual disturbances ENT: No sore throat, ear pain Cardiac: No chest pain Respiratory: No shortness of breath, wheezing, or stridor Abdomen: Occasional crampy left lower quadrant abdominal pain Endocrine: No weight loss, No night sweats Extremities: No peripheral edema, cyanosis Skin: No rashes, easy bruising Neurologic: No focal weakness, trouble with speech or swollowing Urologic: No dysuria, Hematuria, or urinary frequency   Physical Exam:  ED Triage Vitals  Enc Vitals Group     BP 10/13/15 1714 139/82 mmHg     Pulse Rate 10/13/15 1714 118  Resp 10/13/15 1714 20     Temp 10/13/15 1714 98.6 F (37 C)     Temp Source 10/13/15 1714 Oral     SpO2 10/13/15 1714 97 %     Weight 10/13/15 1714 150 lb (68.04 kg)     Height 10/13/15 1714  (1.575 m)     Head Cir --      Peak Flow --      Pain Score 10/13/15 1715 3     Pain Loc --      Pain Edu? --      Excl. in GC? --     General: Awake , Alert , and Oriented times 3; GCS 15 Head: Normal cephalic , atraumatic Eyes: Pupils equal , round, reactive to light Nose/Throat: No nasal drainage, patent upper airway without erythema or exudate.  Neck: Supple, Full range of motion, No anterior adenopathy or palpable thyroid masses Lungs: Clear to ascultation without wheezes , rhonchi, or rales Heart: Regular rate, regular rhythm without murmurs , gallops , or rubs Abdomen:  Soft, non tender without rebound, guarding , or rigidity; bowel sounds positive and symmetric in all 4 quadrants. No organomegaly .        Extremities: 2 plus symmetric pulses. No edema, clubbing or cyanosis Neurologic: normal ambulation, Motor symmetric without deficits, sensory intact Skin: warm, dry, no rashes   Labs:   All laboratory work was reviewed including any pertinent negatives or positives listed below:  Labs Reviewed  COMPREHENSIVE METABOLIC PANEL - Abnormal; Notable for the following:    Potassium 2.9 (*)    All other components within normal limits  CBC - Abnormal; Notable for the following:    WBC 12.9 (*)    All other components within normal limits  URINALYSIS COMPLETEWITH MICROSCOPIC ([facility] ONLY) - Abnormal; Notable for the following:    Color, Urine YELLOW (*)    APPearance HAZY (*)    Ketones, ur TRACE (*)    Protein, ur 30 (*)    Squamous Epithelial / LPF 6-30 (*)    All other components within normal limits  LIPASE, BLOOD  POC URINE PREG, ED  POCT PREGNANCY, URINE  Patient's labs showed a slightly elevated white blood cell count but otherwise no acute abnormalities    ED Course: Patient was given Zofran with symptomatic improvement the triage area. She's been able tolerate oral fluids and I felt most of her nausea and vomiting complaints of being corrected. Patient states that she's never felt well since she had a viral gastroenteritis several months ago. I advised her that today her laboratories work other than her potassium being low was essentially within normal limits and she does not have a fever, etc. I felt this was unlikely to be a surgical abdomen felt further imaging was not necessary at this time. Findings to the patient at the bedside and we agree with outpatient management and she was encouraged to follow up with her primary physician and neck continue her evaluation as an outpatient. Patient was advised to return here if she has bloody diarrhea,  high fever, right-sided abdominal pain or any other new concerns.* She's not been on any antibiotic therapy during Indicates C. difficile and does not sound as though she's had any continuous diarrhea during her stay here in emergency department   Assessment:  Viral gastroenteritis  Final Clinical Impression:   Final diagnoses:  Viral gastroenteritis     Plan:  Outpatient management Patient was advised to return immediately if condition worsens.  Patient was advised to follow up with their primary care physician or other specialized physicians involved in their outpatient care. The patient and/or family member/power of attorney had laboratory results reviewed at the bedside. All questions and concerns were addressed and appropriate discharge instructions were distributed by the nursing staff.             Jennye MoccasinBrian S Quigley, MD 10/14/15 [phone]

## 2015-11-25 ENCOUNTER — Emergency Department
Admission: EM | Admit: 2015-11-25 | Discharge: 2015-11-25 | Disposition: A | Discharge status: Home / Self Care | Payer: Self-pay | Attending: Emergency Medicine | Admitting: Emergency Medicine

## 2015-11-25 ENCOUNTER — Emergency Department: Payer: Self-pay

## 2015-11-25 ENCOUNTER — Encounter: Payer: Self-pay | Admitting: *Deleted

## 2015-11-25 DIAGNOSIS — N938 Other specified abnormal uterine and vaginal bleeding: Secondary | ICD-10-CM | POA: Insufficient documentation

## 2015-11-25 DIAGNOSIS — F17200 Nicotine dependence, unspecified, uncomplicated: Secondary | ICD-10-CM | POA: Insufficient documentation

## 2015-11-25 DIAGNOSIS — R102 Pelvic and perineal pain: Secondary | ICD-10-CM | POA: Insufficient documentation

## 2015-11-25 DIAGNOSIS — N939 Abnormal uterine and vaginal bleeding, unspecified: Secondary | ICD-10-CM

## 2015-11-25 LAB — URINALYSIS COMPLETE WITH MICROSCOPIC (ARMC ONLY)
Bilirubin Urine: NEGATIVE
GLUCOSE, UA: NEGATIVE mg/dL
KETONES UR: NEGATIVE mg/dL
Leukocytes, UA: NEGATIVE
NITRITE: NEGATIVE
PROTEIN: NEGATIVE mg/dL
SPECIFIC GRAVITY, URINE: 1.03 (ref 1.005–1.030)
pH: 5 (ref 5.0–8.0)

## 2015-11-25 LAB — WET PREP, GENITAL
CLUE CELLS WET PREP: NONE SEEN
Sperm: NONE SEEN
Trich, Wet Prep: NONE SEEN
YEAST WET PREP: NONE SEEN

## 2015-11-25 LAB — CHLAMYDIA/NGC RT PCR (ARMC ONLY)
CHLAMYDIA TR: NOT DETECTED
N GONORRHOEAE: NOT DETECTED

## 2015-11-25 LAB — POCT PREGNANCY, URINE: Preg Test, Ur: NEGATIVE

## 2015-11-25 MED ORDER — PROMETHAZINE HCL 12.5 MG PO TABS: 12.5000 mg | ORAL_TABLET | Freq: Four times a day (QID) | ORAL | Status: DC | PRN

## 2015-11-25 MED ORDER — ACETAMINOPHEN 500 MG PO TABS
1000.0000 mg | ORAL_TABLET | Freq: Once | ORAL | Status: AC
  Administered 2015-11-25: 1000 mg via ORAL
  Filled 2015-11-25: qty 2

## 2015-11-25 MED ORDER — RANITIDINE HCL 150 MG PO CAPS: 150.0000 mg | ORAL_CAPSULE | Freq: Two times a day (BID) | ORAL | Status: DC

## 2015-11-25 NOTE — ED Notes (Signed)
Pt verbalized understanding of discharge instructions. NAD at this time. 

## 2015-11-25 NOTE — ED Provider Notes (Signed)
[facility] Emergency Department Provider Note  ____________________________________________  Time seen: 3:00 PM  I have reviewed the triage vital signs and the nursing notes.   HISTORY  Chief Complaint Vaginal Bleeding    HPI Casey Meyer is a 35 y.o. female reports that she has had chronic vomiting for years. This is mainly in the morning after lying flat at night. She has been diagnosed with gastritis and ulcers as a child but has not been taking any antacids for many months.  She does report that last night she was having vigorous sexual intercourse with her husband and felt some discomfort afterward. This morning with her usual morning vomiting, she felt pain in the right lower side and pelvic area. She also had some light vaginal bleeding at that time. It has since resolved. Does not denies any vaginal discharge or any concern that she could have an STI. She reports she has been monogamous with her husband for 17 years.     History reviewed. No pertinent past medical history. Chronic vomiting  There are no active problems to display for this patient.    Past Surgical History  Procedure Laterality Date  . Nose surgery       Current Outpatient Rx  Name  Route  Sig  Dispense  Refill  . dicyclomine (BENTYL) 20 MG tablet   Oral   Take 1 tablet (20 mg total) by mouth 3 (three) times daily as needed for spasms.   30 tablet   0   . ketorolac (TORADOL) 10 MG tablet   Oral   Take 1 tablet (10 mg total) by mouth every 6 (six) hours as needed.   20 tablet   0   . metoCLOPramide (REGLAN) 5 MG tablet   Oral   Take 1 tablet (5 mg total) by mouth 3 (three) times daily.   15 tablet   0   . potassium chloride (K-DUR) 10 MEQ tablet   Oral   Take 1 tablet (10 mEq total) by mouth 2 (two) times daily.   7 tablet   0   . promethazine (PHENERGAN) 12.5 MG tablet   Oral   Take 1 tablet (12.5 mg total) by mouth every 6 (six) hours as needed for  nausea or vomiting.   30 tablet   0   . ranitidine (ZANTAC) 150 MG capsule   Oral   Take 1 capsule (150 mg total) by mouth 2 (two) times daily.   28 capsule   0      Allergies Bee venom   Family History  Problem Relation Age of Onset  . Cancer Father     Social History Social History  Substance Use Topics  . Smoking status: Current Every Day Smoker  . Smokeless tobacco: None  . Alcohol Use: No    Review of Systems  Constitutional:   No fever or chills.  Eyes:   No vision changes.  ENT:   No sore throat. No rhinorrhea. Cardiovascular:   No chest pain. Respiratory:   No dyspnea or cough. Gastrointestinal:   Positive for pelvic pain today, positive chronic vomiting.  Genitourinary:   Negative for dysuria or difficulty urinating. Positive vaginal bleeding Musculoskeletal:   Negative for focal pain or swelling Neurological:   Negative for headaches 10-point ROS otherwise negative.  ____________________________________________   PHYSICAL EXAM:  VITAL SIGNS: ED Triage Vitals  Enc Vitals Group     BP 11/25/15 1225 129/80 mmHg     Pulse Rate 11/25/15 1225  91     Resp 11/25/15 1225 18     Temp 11/25/15 1225 98.1 F (36.7 C)     Temp Source 11/25/15 1225 Oral     SpO2 11/25/15 1225 99 %     Weight 11/25/15 1225 140 lb (63.504 kg)     Height 11/25/15 1225 5\' 2"  (1.575 m)     Head Cir --      Peak Flow --      Pain Score 11/25/15 1225 7     Pain Loc --      Pain Edu? --      Excl. in GC? --     Vital signs reviewed, nursing assessments reviewed.   Constitutional:   Alert and oriented. Well appearing and in no distress. Eyes:   No scleral icterus. No conjunctival pallor. PERRL. EOMI.  No nystagmus. ENT   Head:   Normocephalic and atraumatic.   Nose:   No congestion/rhinnorhea. No septal hematoma   Mouth/Throat:   MMM, no pharyngeal erythema. No peritonsillar mass.    Neck:   No stridor. No SubQ emphysema. No  meningismus. Hematological/Lymphatic/Immunilogical:   No cervical lymphadenopathy. Cardiovascular:   RRR. Symmetric bilateral radial and DP pulses.  No murmurs.  Respiratory:   Normal respiratory effort without tachypnea nor retractions. Breath sounds are clear and equal bilaterally. No wheezes/rales/rhonchi. Gastrointestinal:   Soft And nontender. Non distended. There is no CVA tenderness.  No rebound, rigidity, or guarding. Genitourinary:   Normal external genitalia. Speculum exam reveals macerated cervix. No blood in the vault or significant discharge. Bimanual exam reveals no CMT, no adnexal tenderness or masses. Uterus is not enlarged. IUD strings are visible, but the device itself does not appear to be protruding. Exam performed with nursing tech at bedside. Musculoskeletal:   Nontender with normal range of motion in all extremities. No joint effusions.  No lower extremity tenderness.  No edema. Neurologic:   Normal speech and language.  CN 2-10 normal. Motor grossly intact. No gross focal neurologic deficits are appreciated.  Skin:    Skin is warm, dry and intact. No rash noted.  No petechiae, purpura, or bullae.  ____________________________________________    LABS (pertinent positives/negatives) (all labs ordered are listed, but only abnormal results are displayed) Labs Reviewed  WET PREP, GENITAL - Abnormal; Notable for the following:    WBC, Wet Prep HPF POC FEW (*)    All other components within normal limits  URINALYSIS COMPLETEWITH MICROSCOPIC ([facility] ONLY) - Abnormal; Notable for the following:    Color, Urine YELLOW (*)    APPearance CLEAR (*)    Hgb urine dipstick 1+ (*)    Bacteria, UA RARE (*)    Squamous Epithelial / LPF 0-5 (*)    All other components within normal limits  CHLAMYDIA/NGC RT PCR ([facility] ONLY)  POC URINE PREG, ED  POCT PREGNANCY, URINE    ____________________________________________   EKG    ____________________________________________    RADIOLOGY  CT abdomen and pelvis unremarkable.  ____________________________________________   PROCEDURES   ____________________________________________   INITIAL IMPRESSION / ASSESSMENT AND PLAN / ED COURSE  Pertinent labs & imaging results that were available during my care of the patient were reviewed by me and considered in my medical decision making (see chart for details).  Patient well appearing no acute distress. Presents with concern that her IUD is dislodged, however exam raises slight concern for appendicitis versus renal stone as well. Urinalysis is unremarkable, CT unremarkable and shows a normal appendix and also  verifies normal positioning of the IUD. No evidence of STI, and patient has low suspicion so she was not given any empiric antibiotics at this time.Considering the patient's symptoms, medical history, and physical examination today, I have low suspicion for cholecystitis or biliary pathology, pancreatitis, perforation or bowel obstruction, hernia, intra-abdominal abscess, AAA or dissection, volvulus or intussusception, mesenteric ischemia, ovarian torsion or ectopic or PID or appendicitis.  With negative results, patient is reassured and feels much better. Abdominal exam remains benign, vital signs unremarkable. I'll refill her chronic Phenergan which she has run out of recently related to her chronic vomiting. Also start her on a trial of antacids.     ____________________________________________   FINAL CLINICAL IMPRESSION(S) / ED DIAGNOSES  Final diagnoses:  Vaginal bleeding, abnormal  Pelvic pain in female       Portions of this note were generated with dragon dictation software. Dictation errors may occur despite best attempts at proofreading.   Sharman Cheek, MD 11/25/15 [phone]

## 2015-11-25 NOTE — ED Notes (Signed)
Patient transported to CT 

## 2015-11-25 NOTE — ED Notes (Addendum)
Pt states she has IUD Mirena and was throwing up felt a pop in her stomach on the right lower side.  Pt states she has been having vaginal bleeding that started today.  Pt states the pain is similar to when she had a miscarriage and feels like her IUD has been dislodge.  IUD has been in place for almost 3.5 years.  Pt states bleeding was a bright red and orange.  Pt states the bloody discharge resembles snot and is slimy.  Pt states the bleeding is not heavy. Pt also c/o burning in her vagina. Pt also reports vomiting every day for 6 months.

## 2015-11-25 NOTE — Discharge Instructions (Signed)
Abdominal Pain, Adult °Many things can cause abdominal pain. Usually, abdominal pain is not caused by a disease and will improve without treatment. It can often be observed and treated at home. Your health care provider will do a physical exam and possibly order blood tests and X-rays to help determine the seriousness of your pain. However, in many cases, more time must pass before a clear cause of the pain can be found. Before that point, your health care provider may not know if you need more testing or further treatment. °HOME CARE INSTRUCTIONS °Monitor your abdominal pain for any changes. The following actions may help to alleviate any discomfort you are experiencing: °· Only take over-the-counter or prescription medicines as directed by your health care provider. °· Do not take laxatives unless directed to do so by your health care provider. °· Try a clear liquid diet (broth, tea, or water) as directed by your health care provider. Slowly move to a bland diet as tolerated. °SEEK MEDICAL CARE IF: °· You have unexplained abdominal pain. °· You have abdominal pain associated with nausea or diarrhea. °· You have pain when you urinate or have a bowel movement. °· You experience abdominal pain that wakes you in the night. °· You have abdominal pain that is worsened or improved by eating food. °· You have abdominal pain that is worsened with eating fatty foods. °· You have a fever. °SEEK IMMEDIATE MEDICAL CARE IF: °· Your pain does not go away within 2 hours. °· You keep throwing up (vomiting). °· Your pain is felt only in portions of the abdomen, such as the right side or the left lower portion of the abdomen. °· You pass bloody or black tarry stools. °MAKE SURE YOU: °· Understand these instructions. °· Will watch your condition. °· Will get help right away if you are not doing well or get worse. °  °This information is not intended to replace advice given to you by your health care provider. Make sure you discuss  any questions you have with your health care provider. °  °Document Released: 04/16/2005 Document Revised: 03/28/2015 Document Reviewed: 03/16/2013 °Elsevier Interactive Patient Education ©2016 [employer] ° °Abnormal Uterine Bleeding °Abnormal uterine bleeding can affect women at various stages in life, including teenagers, women in their reproductive years, pregnant women, and women who have reached menopause. Several kinds of uterine bleeding are considered abnormal, including: °· Bleeding or spotting between periods.   °· Bleeding after sexual intercourse.   °· Bleeding that is heavier or more than normal.   °· Periods that last longer than usual. °· Bleeding after menopause.   °Many cases of abnormal uterine bleeding are minor and simple to treat, while others are more serious. Any type of abnormal bleeding should be evaluated by your health care provider. Treatment will depend on the cause of the bleeding. °HOME CARE INSTRUCTIONS °Monitor your condition for any changes. The following actions may help to alleviate any discomfort you are experiencing: °· Avoid the use of tampons and douches as directed by your health care provider. °· Change your pads frequently. °You should get regular pelvic exams and Pap tests. Keep all follow-up appointments for diagnostic tests as directed by your health care provider.  °SEEK MEDICAL CARE IF:  °· Your bleeding lasts more than 1 week.   °· You feel dizzy at times.   °SEEK IMMEDIATE MEDICAL CARE IF:  °· You pass out.   °· You are changing pads every 15 to 30 minutes.   °· You have abdominal pain. °· You have   a fever.   °· You become sweaty or weak.   °· You are passing large blood clots from the vagina.   °· You start to feel nauseous and vomit. °MAKE SURE YOU:  °· Understand these instructions. °· Will watch your condition. °· Will get help right away if you are not doing well or get worse. °  °This information is not intended to replace advice given to you by your health  care provider. Make sure you discuss any questions you have with your health care provider. °  °Document Released: 07/07/2005 Document Revised: 07/12/2013 Document Reviewed: 02/03/2013 °Elsevier Interactive Patient Education ©2016 [employer] ° °

## 2015-11-25 NOTE — ED Notes (Signed)
States she felt wet between her legs and noticed orange pink blood coming from her vagina and noticed her Merina "wire was hanging out", states she also felt a sharp pain

## 2019-01-15 ENCOUNTER — Emergency Department
Admission: EM | Admit: 2019-01-15 | Discharge: 2019-01-15 | Disposition: A | Discharge status: Home / Self Care | Payer: Self-pay | Attending: Emergency Medicine | Admitting: Emergency Medicine

## 2019-01-15 ENCOUNTER — Other Ambulatory Visit: Payer: Self-pay

## 2019-01-15 ENCOUNTER — Emergency Department: Payer: Self-pay

## 2019-01-15 DIAGNOSIS — F17210 Nicotine dependence, cigarettes, uncomplicated: Secondary | ICD-10-CM | POA: Insufficient documentation

## 2019-01-15 DIAGNOSIS — Z79899 Other long term (current) drug therapy: Secondary | ICD-10-CM | POA: Insufficient documentation

## 2019-01-15 DIAGNOSIS — R197 Diarrhea, unspecified: Secondary | ICD-10-CM

## 2019-01-15 DIAGNOSIS — E876 Hypokalemia: Secondary | ICD-10-CM | POA: Insufficient documentation

## 2019-01-15 DIAGNOSIS — R809 Proteinuria, unspecified: Secondary | ICD-10-CM | POA: Insufficient documentation

## 2019-01-15 DIAGNOSIS — K529 Noninfective gastroenteritis and colitis, unspecified: Secondary | ICD-10-CM | POA: Insufficient documentation

## 2019-01-15 DIAGNOSIS — R112 Nausea with vomiting, unspecified: Secondary | ICD-10-CM | POA: Insufficient documentation

## 2019-01-15 HISTORY — DX: Bipolar disorder, unspecified: F31.9

## 2019-01-15 LAB — COMPREHENSIVE METABOLIC PANEL
ALT: 19 U/L (ref 0–44)
AST: 25 U/L (ref 15–41)
Albumin: 4.8 g/dL (ref 3.5–5.0)
Alkaline Phosphatase: 62 U/L (ref 38–126)
Anion gap: 12 (ref 5–15)
BUN: 12 mg/dL (ref 6–20)
CO2: 23 mmol/L (ref 22–32)
Calcium: 9.2 mg/dL (ref 8.9–10.3)
Chloride: 104 mmol/L (ref 98–111)
Creatinine, Ser: 0.88 mg/dL (ref 0.44–1.00)
GFR calc Af Amer: >60 mL/min (ref >60)
GFR calc non Af Amer: >60 mL/min (ref >60)
Glucose, Bld: 114 mg/dL — ABNORMAL HIGH (ref 70–99)
Potassium: 2.9 mmol/L — ABNORMAL LOW (ref 3.5–5.1)
Sodium: 139 mmol/L (ref 135–145)
Total Bilirubin: 0.9 mg/dL (ref 0.3–1.2)
Total Protein: 7.7 g/dL (ref 6.5–8.1)

## 2019-01-15 LAB — CBC WITH DIFFERENTIAL/PLATELET
Abs Immature Granulocytes: 0.07 10*3/uL (ref 0.00–0.07)
Basophils Absolute: 0 10*3/uL (ref 0.0–0.1)
Basophils Relative: 0 %
Eosinophils Absolute: 0 10*3/uL (ref 0.0–0.5)
Eosinophils Relative: 0 %
HCT: 37.1 % (ref 36.0–46.0)
Hemoglobin: 13.1 g/dL (ref 12.0–15.0)
Immature Granulocytes: 0 %
Lymphocytes Relative: 13 %
Lymphs Abs: 2.1 10*3/uL (ref 0.7–4.0)
MCH: 30.8 pg (ref 26.0–34.0)
MCHC: 35.3 g/dL (ref 30.0–36.0)
MCV: 87.3 fL (ref 80.0–100.0)
Monocytes Absolute: 0.9 10*3/uL (ref 0.1–1.0)
Monocytes Relative: 6 %
Neutro Abs: 13 10*3/uL — ABNORMAL HIGH (ref 1.7–7.7)
Neutrophils Relative %: 81 %
Platelets: 224 10*3/uL (ref 150–400)
RBC: 4.25 MIL/uL (ref 3.87–5.11)
RDW: 12.9 % (ref 11.5–15.5)
WBC: 16.1 10*3/uL — ABNORMAL HIGH (ref 4.0–10.5)
nRBC: 0 % (ref 0.0–0.2)

## 2019-01-15 LAB — URINALYSIS, COMPLETE (UACMP) WITH MICROSCOPIC
Bacteria, UA: NONE SEEN
Bilirubin Urine: NEGATIVE
Glucose, UA: NEGATIVE mg/dL
Ketones, ur: 80 mg/dL — AB
Leukocytes,Ua: NEGATIVE
Nitrite: NEGATIVE
Protein, ur: 100 mg/dL — AB
Specific Gravity, Urine: 1.033 — ABNORMAL HIGH (ref 1.005–1.030)
pH: 5 (ref 5.0–8.0)

## 2019-01-15 LAB — TYPE AND SCREEN
ABO/RH(D): A POS
Antibody Screen: NEGATIVE

## 2019-01-15 LAB — POCT PREGNANCY, URINE: Preg Test, Ur: NEGATIVE

## 2019-01-15 LAB — PROTIME-INR
INR: 1.1 (ref 0.8–1.2)
Prothrombin Time: 14.2 seconds (ref 11.4–15.2)

## 2019-01-15 LAB — LIPASE, BLOOD: Lipase: 20 U/L (ref 11–51)

## 2019-01-15 MED ORDER — ONDANSETRON HCL 4 MG/2ML IJ SOLN
4.0000 mg | Freq: Once | INTRAMUSCULAR | Status: AC
  Administered 2019-01-15: 4 mg via INTRAVENOUS
  Filled 2019-01-15: qty 2

## 2019-01-15 MED ORDER — SODIUM CHLORIDE 0.9 % IV BOLUS
1000.0000 mL | Freq: Once | INTRAVENOUS | Status: AC
Start: 2019-01-15 — End: 2019-01-15
  Administered 2019-01-15: 1000 mL via INTRAVENOUS

## 2019-01-15 MED ORDER — FAMOTIDINE 20 MG PO TABS: 20.0000 mg | ORAL_TABLET | Freq: Two times a day (BID) | ORAL | 1 refills | Status: DC

## 2019-01-15 MED ORDER — POTASSIUM CHLORIDE CRYS ER 20 MEQ PO TBCR
40.0000 meq | EXTENDED_RELEASE_TABLET | Freq: Once | ORAL | Status: AC
  Administered 2019-01-15: 40 meq via ORAL
  Filled 2019-01-15: qty 2

## 2019-01-15 MED ORDER — ONDANSETRON 4 MG PO TBDP: 4.0000 mg | ORAL_TABLET | Freq: Three times a day (TID) | ORAL | 0 refills | Status: DC | PRN

## 2019-01-15 MED ORDER — ALUM & MAG HYDROXIDE-SIMETH 200-200-20 MG/5ML PO SUSP
30.0000 mL | Freq: Once | ORAL | Status: AC
  Administered 2019-01-15: 30 mL via ORAL
  Filled 2019-01-15: qty 30

## 2019-01-15 MED ORDER — FAMOTIDINE IN NACL 20-0.9 MG/50ML-% IV SOLN
20.0000 mg | Freq: Once | INTRAVENOUS | Status: AC
  Administered 2019-01-15: 20 mg via INTRAVENOUS
  Filled 2019-01-15: qty 50

## 2019-01-15 MED ORDER — LIDOCAINE VISCOUS HCL 2 % MT SOLN
15.0000 mL | Freq: Once | OROMUCOSAL | Status: AC
  Administered 2019-01-15: 15 mL via ORAL
  Filled 2019-01-15: qty 15

## 2019-01-15 MED ORDER — IOHEXOL 300 MG/ML  SOLN
100.0000 mL | Freq: Once | INTRAMUSCULAR | Status: AC | PRN
  Administered 2019-01-15: 100 mL via INTRAVENOUS

## 2019-01-15 NOTE — Discharge Instructions (Addendum)
As explained to you, taking any pain medication over-the-counter in the large quantities that you are can be extremely detrimental to your body.  At this time I see protein in your urine which can be signs of early kidney disease from the large amounts of medicine that you are taking.  You also probably have an ulcer in your stomach which is causing you to have this chronic pain.  Avoid taking any over-the-counter medications for at least the next month and take Pepcid daily as prescribed to allow for her stomach to heal.  Follow-up with primary care doctor for further care about your kidney.  Take Zofran as needed for nausea and vomiting.

## 2019-01-15 NOTE — ED Triage Notes (Signed)
Patient c/o abdominal pain, abdominal distention, N/V/D beginning Wednesday. Patient reports she has been unable to eat since Wednesday. Patient pacing, appear anxious. Patient c/o weakness and loss of taste.

## 2019-01-15 NOTE — ED Provider Notes (Signed)
[facility] Emergency Department Provider Note  ____________________________________________  Time seen: Approximately 3:12 AM  I have reviewed the triage vital signs and the nursing notes.   HISTORY  Chief Complaint Abdominal Pain   HPI Casey Meyer is a 38 y.o. female with a history of bipolar disorder who presents for evaluation of nausea, vomiting, and diarrhea.  Patient reports that her symptoms have been ongoing for a week.  She has had several daily episodes of vomiting and diarrhea.  Reports that her diarrhea is black.  Reports that the vomit is black with some specks of blood.  Patient has chronic epigastric and mid back pain that she describes as burning.  She reports that she takes 6-8 naproxen daily for this pain and has been doing so for several years.  She reports being told by a doctor when she was a teenager that she had an ulcer however patient continues to take NSAIDs and reports "this is the only way I can function and take care of my family".  She denies abdominal pain but does report the chronic burning epigastric and mid back pain.  No fevers but has had chills.  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  No cough, no chest pain or shortness of breath.  Past Medical History:  Diagnosis Date   Bipolar 1 disorder [facility](HCC)      Past Surgical History:  Procedure Laterality Date   NOSE SURGERY      Prior to Admission medications   Medication Sig Start Date End Date Taking? Authorizing Provider  dicyclomine (BENTYL) 20 MG tablet Take 1 tablet (20 mg total) by mouth 3 (three) times daily as needed for spasms. 10/13/15   Jennye MoccasinQuigley, Brian S, MD  famotidine (PEPCID) 20 MG tablet Take 1 tablet (20 mg total) by mouth 2 (two) times daily. 01/15/19 01/15/20  Nita SickleVeronese, [place], MD  ketorolac (TORADOL) 10 MG tablet Take 1 tablet (10 mg total) by mouth every 6 (six) hours as needed. 05/20/15   Beers, Charmayne Sheerharles M, PA-C  metoCLOPramide (REGLAN)  5 MG tablet Take 1 tablet (5 mg total) by mouth 3 (three) times daily. 07/08/15   Darien RamusKaminski, David W, MD  ondansetron (ZOFRAN ODT) 4 MG disintegrating tablet Take 1 tablet (4 mg total) by mouth every 8 (eight) hours as needed. 01/15/19   Nita SickleVeronese, [place], MD  potassium chloride (K-DUR) 10 MEQ tablet Take 1 tablet (10 mEq total) by mouth 2 (two) times daily. 10/13/15   Jennye MoccasinQuigley, Brian S, MD  promethazine (PHENERGAN) 12.5 MG tablet Take 1 tablet (12.5 mg total) by mouth every 6 (six) hours as needed for nausea or vomiting. 11/25/15   Sharman CheekStafford, Phillip, MD  ranitidine (ZANTAC) 150 MG capsule Take 1 capsule (150 mg total) by mouth 2 (two) times daily. 11/25/15   Sharman CheekStafford, Phillip, MD    Allergies Bee venom  Family History  Problem Relation Age of Onset   Cancer Father     Social History Social History   Tobacco Use   Smoking status: Current Every Day Smoker   Smokeless tobacco: Never Used  Substance Use Topics   Alcohol use: No   Drug use: No    Review of Systems  Constitutional: Negative for fever. Eyes: Negative for visual changes. ENT: Negative for sore throat. Neck: No neck pain  Cardiovascular: Negative for chest pain. Respiratory: Negative for shortness of breath. Gastrointestinal: + chronic abdominal pain, vomiting and diarrhea. Genitourinary: Negative for dysuria. Musculoskeletal: + chronic mid back  pain. Skin: Negative for rash. Neurological: Negative for headaches, weakness or numbness. Psych: No SI or HI  ____________________________________________   PHYSICAL EXAM:  VITAL SIGNS: ED Triage Vitals  Enc Vitals Group     BP 01/15/19 0223 140/81     Pulse Rate 01/15/19 0223 (!) 103     Resp 01/15/19 0223 20     Temp 01/15/19 0223 97.7 F (36.5 C)     Temp Source 01/15/19 0223 Oral     SpO2 01/15/19 0223 94 %     Weight 01/15/19 0220 148 lb (67.1 kg)     Height 01/15/19 0220 5\' 2"  (1.575 m)     Head Circumference --      Peak Flow --      Pain Score  01/15/19 0220 8     Pain Loc --      Pain Edu? --      Excl. in GC? --     Constitutional: Alert and oriented. Well appearing and in no apparent distress. HEENT:      Head: Normocephalic and atraumatic.         Eyes: Conjunctivae are normal. Sclera is non-icteric.       Mouth/Throat: Mucous membranes are moist.       Neck: Supple with no signs of meningismus. Cardiovascular: Tachycardic with regular rate and rhythm. No murmurs, gallops, or rubs. 2+ symmetrical distal pulses are present in all extremities. No JVD. Respiratory: Normal respiratory effort. Lungs are clear to auscultation bilaterally. No wheezes, crackles, or rhonchi.  Gastrointestinal: Soft, tender to palpation over the epigastric and left upper quadrant, and non distended with positive bowel sounds. No rebound or guarding. Genitourinary: No CVA tenderness.  Rectal exam showing no stool in the rectal vault, Hemoccult negative Musculoskeletal: Nontender with normal range of motion in all extremities. No edema, cyanosis, or erythema of extremities. Neurologic: Normal speech and language. Face is symmetric. Moving all extremities. No gross focal neurologic deficits are appreciated. Skin: Skin is warm, dry and intact. No rash noted. Psychiatric: Mood and affect are normal. Speech and behavior are normal.  ____________________________________________   LABS (all labs ordered are listed, but only abnormal results are displayed)  Labs Reviewed  COMPREHENSIVE METABOLIC PANEL - Abnormal; Notable for the following components:      Result Value   Potassium 2.9 (*)    Glucose, Bld 114 (*)    All other components within normal limits  URINALYSIS, COMPLETE (UACMP) WITH MICROSCOPIC - Abnormal; Notable for the following components:   Color, Urine YELLOW (*)    APPearance HAZY (*)    Specific Gravity, Urine 1.033 (*)    Hgb urine dipstick SMALL (*)    Ketones, ur 80 (*)    Protein, ur 100 (*)    All other components within normal  limits  CBC WITH DIFFERENTIAL/PLATELET - Abnormal; Notable for the following components:   WBC 16.1 (*)    Neutro Abs 13.0 (*)    All other components within normal limits  LIPASE, BLOOD  PROTIME-INR  POC URINE PREG, ED  POCT PREGNANCY, URINE  TYPE AND SCREEN   ____________________________________________  EKG  ED ECG REPORT I, Nita Sicklearolina Amandamarie Feggins, the attending physician, personally viewed and interpreted this ECG.  Normal sinus rhythm, rate of 95, normal intervals, normal axis, no ST elevations or depressions, Q waves in inferior leads.  Unchanged from prior from 2013 ____________________________________________  RADIOLOGY  I have personally reviewed the images performed during this visit and I agree with the Radiologist's read.  Interpretation by Radiologist:  Ct Abdomen Pelvis W Contrast  Result Date: 01/15/2019 CLINICAL DATA:  Abdominal pain EXAM: CT ABDOMEN AND PELVIS WITH CONTRAST TECHNIQUE: Multidetector CT imaging of the abdomen and pelvis was performed using the standard protocol following bolus administration of intravenous contrast. CONTRAST:  100mL OMNIPAQUE IOHEXOL 300 MG/ML  SOLN COMPARISON:  11/25/2015 FINDINGS: LOWER CHEST: There is no basilar pleural or apical pericardial effusion. HEPATOBILIARY: The hepatic contours and density are normal. There is no intra- or extrahepatic biliary dilatation. The gallbladder is normal. PANCREAS: The pancreatic parenchymal contours are normal and there is no ductal dilatation. There is no peripancreatic fluid collection. SPLEEN: Normal. ADRENALS/URINARY TRACT: --Adrenal glands: Normal. --Right kidney/ureter: No hydronephrosis, nephroureterolithiasis, perinephric stranding or solid renal mass. --Left kidney/ureter: No hydronephrosis, nephroureterolithiasis, perinephric stranding or solid renal mass. --Urinary bladder: Normal for degree of distention STOMACH/BOWEL: --Stomach/Duodenum: There is no hiatal hernia or other gastric  abnormality. The duodenal course and caliber are normal. --Small bowel: No dilatation or inflammation. --Colon: No focal abnormality. --Appendix: Normal. VASCULAR/LYMPHATIC: Normal course and caliber of the major abdominal vessels. No abdominal or pelvic lymphadenopathy. REPRODUCTIVE: Normal uterus and ovaries. MUSCULOSKELETAL. No bony spinal canal stenosis or focal osseous abnormality. OTHER: None. IMPRESSION: No acute abdominal or pelvic abnormality. Electronically Signed   By: Deatra RobinsonKevin  Herman M.D.   On: 01/15/2019 04:37   Dg Abdomen Acute W/chest  Result Date: 01/15/2019 CLINICAL DATA:  Epigastric pain EXAM: DG ABDOMEN ACUTE W/ 1V CHEST COMPARISON:  Chest x-ray dated 03/29/2012 FINDINGS: There are few scattered air-fluid levels throughout the small bowel without evidence of significant small bowel dilatation. No pneumatosis or free air. There is a normal stool burden. No acute osseous abnormality. The lungs are clear. The heart size is normal. There is no pneumothorax. No large pleural effusion. No focal infiltrate. IMPRESSION: 1. Scattered air-fluid levels throughout the abdomen. There are no convincing dilated loops of small bowel. Findings could represent an enteritis, ileus, or low-grade developing small bowel obstruction. 2. No acute cardiopulmonary process. Electronically Signed   By: Katherine Mantlehristopher  Green M.D.   On: 01/15/2019 03:41      ____________________________________________   PROCEDURES  Procedure(s) performed: None Procedures Critical Care performed:  None ____________________________________________   INITIAL IMPRESSION / ASSESSMENT AND PLAN / ED COURSE  38 y.o. female with a history of bipolar disorder who presents for evaluation of nausea, vomiting, and diarrhea.  Patient reports black emesis and black stools, several times daily for a week.  She has a history of chronic NSAID use and takes 6-8 naproxen daily for years.  Rectal exam showing no stool in the rectal vault.  She is  otherwise hemodynamically stable with normal hemoglobin.  Her abdomen is tender to palpation epigastric region.  She endorses chronic epigastric and mid back pain which is identical to the pain she is experiencing at this time.  I am worried about an ulcer in the setting of NSAID use versus gastritis versus gastroenteritis versus colitis.  Will check labs to rule out electrolyte abnormalities, AKI, dehydration.  Will give IV fluids, IV Pepcid, GI cocktail, Zofran and reassess.    _________________________ 5:01 AM on 01/15/2019 -----------------------------------------  Labs showing mild hypokalemia which was supplemented orally but no other significant abnormalities.  Imaging studies with no acute findings.  Patient feels markedly improved and is tolerating p.o.  No vomiting or diarrhea in the emergency room.  Had a very long discussion with the patient about the harms of chronic use of NSAIDs especially in large quantities like  patient has been using it.  I explained to her that I think her pain is most likely due to gastritis or peptic ulcer disease and that taking Excedrin is just going to make her feel more pain.  Recommended discontinuing all over-the-counter pain medications and taking Pepcid once a day for 30 days to allow for healing.  I also provided her with Zofran for nausea and vomiting.  Discussed close follow-up with primary care doctor for findings of proteinuria most likely due to NSAID use.  Discussed my standard return precautions with patient.   As part of my medical decision making, I reviewed the following data within the [place] notes reviewed and incorporated, Labs reviewed , EKG interpreted , Old EKG reviewed, Old chart reviewed, Radiograph reviewed , Notes from prior ED visits and [place] Controlled Substance Database    Pertinent labs & imaging results that were available during my care of the patient were reviewed by me and considered in my medical  decision making (see chart for details).    ____________________________________________   FINAL CLINICAL IMPRESSION(S) / ED DIAGNOSES  Final diagnoses:  Nausea vomiting and diarrhea  Gastroenteritis  Hypokalemia  Proteinuria, unspecified type      NEW MEDICATIONS STARTED DURING THIS VISIT:  ED Discharge Orders         Ordered    ondansetron (ZOFRAN ODT) 4 MG disintegrating tablet  Every 8 hours PRN     01/15/19 0459    famotidine (PEPCID) 20 MG tablet  2 times daily     01/15/19 0459           Note:  This document was prepared using Dragon voice recognition software and may include unintentional dictation errors.    Alfred Levins, [place], MD 01/15/19 [phone]

## 2019-11-06 ENCOUNTER — Other Ambulatory Visit: Payer: Self-pay

## 2019-11-06 ENCOUNTER — Emergency Department
Admission: EM | Admit: 2019-11-06 | Discharge: 2019-11-06 | Disposition: A | Discharge status: Home / Self Care | Payer: Medicaid Other | Attending: Emergency Medicine | Admitting: Emergency Medicine

## 2019-11-06 ENCOUNTER — Encounter: Payer: Self-pay | Admitting: Emergency Medicine

## 2019-11-06 DIAGNOSIS — K257 Chronic gastric ulcer without hemorrhage or perforation: Secondary | ICD-10-CM | POA: Insufficient documentation

## 2019-11-06 DIAGNOSIS — R197 Diarrhea, unspecified: Secondary | ICD-10-CM | POA: Insufficient documentation

## 2019-11-06 DIAGNOSIS — F17210 Nicotine dependence, cigarettes, uncomplicated: Secondary | ICD-10-CM | POA: Diagnosis not present

## 2019-11-06 DIAGNOSIS — R112 Nausea with vomiting, unspecified: Secondary | ICD-10-CM

## 2019-11-06 DIAGNOSIS — R1110 Vomiting, unspecified: Secondary | ICD-10-CM | POA: Diagnosis present

## 2019-11-06 LAB — COMPREHENSIVE METABOLIC PANEL
ALT: 27 U/L (ref 0–44)
AST: 21 U/L (ref 15–41)
Albumin: 4.6 g/dL (ref 3.5–5.0)
Alkaline Phosphatase: 64 U/L (ref 38–126)
Anion gap: 12 (ref 5–15)
BUN: 8 mg/dL (ref 6–20)
CO2: 20 mmol/L — ABNORMAL LOW (ref 22–32)
Calcium: 9.3 mg/dL (ref 8.9–10.3)
Chloride: 106 mmol/L (ref 98–111)
Creatinine, Ser: 0.82 mg/dL (ref 0.44–1.00)
GFR calc Af Amer: >60 mL/min (ref >60)
GFR calc non Af Amer: >60 mL/min (ref >60)
Glucose, Bld: 105 mg/dL — ABNORMAL HIGH (ref 70–99)
Potassium: 2.9 mmol/L — ABNORMAL LOW (ref 3.5–5.1)
Sodium: 138 mmol/L (ref 135–145)
Total Bilirubin: 1.9 mg/dL — ABNORMAL HIGH (ref 0.3–1.2)
Total Protein: 8.1 g/dL (ref 6.5–8.1)

## 2019-11-06 LAB — CK: Total CK: 120 U/L (ref 38–234)

## 2019-11-06 LAB — URINALYSIS, COMPLETE (UACMP) WITH MICROSCOPIC
Bilirubin Urine: NEGATIVE
Glucose, UA: NEGATIVE mg/dL
Ketones, ur: 5 mg/dL — AB
Leukocytes,Ua: NEGATIVE
Nitrite: NEGATIVE
Protein, ur: 100 mg/dL — AB
Specific Gravity, Urine: 1.027 (ref 1.005–1.030)
pH: 5 (ref 5.0–8.0)

## 2019-11-06 LAB — LIPASE, BLOOD: Lipase: 20 U/L (ref 11–51)

## 2019-11-06 LAB — T4, FREE: Free T4: 1.27 ng/dL — ABNORMAL HIGH (ref 0.61–1.12)

## 2019-11-06 LAB — CBC
HCT: 40.7 % (ref 36.0–46.0)
Hemoglobin: 14.6 g/dL (ref 12.0–15.0)
MCH: 32.3 pg (ref 26.0–34.0)
MCHC: 35.9 g/dL (ref 30.0–36.0)
MCV: 90 fL (ref 80.0–100.0)
Platelets: 244 10*3/uL (ref 150–400)
RBC: 4.52 MIL/uL (ref 3.87–5.11)
RDW: 13.2 % (ref 11.5–15.5)
WBC: 12.3 10*3/uL — ABNORMAL HIGH (ref 4.0–10.5)
nRBC: 0 % (ref 0.0–0.2)

## 2019-11-06 LAB — TSH: TSH: 2.336 u[IU]/mL (ref 0.350–4.500)

## 2019-11-06 LAB — PREGNANCY, URINE: Preg Test, Ur: NEGATIVE

## 2019-11-06 MED ORDER — ONDANSETRON HCL 4 MG/2ML IJ SOLN
4.0000 mg | Freq: Once | INTRAMUSCULAR | Status: AC
  Administered 2019-11-06: 4 mg via INTRAVENOUS
  Filled 2019-11-06: qty 2

## 2019-11-06 MED ORDER — POTASSIUM CHLORIDE ER 20 MEQ PO TBCR: 20.0000 meq | EXTENDED_RELEASE_TABLET | Freq: Every day | ORAL | 0 refills | Status: DC

## 2019-11-06 MED ORDER — POTASSIUM CHLORIDE CRYS ER 20 MEQ PO TBCR
40.0000 meq | EXTENDED_RELEASE_TABLET | Freq: Once | ORAL | Status: AC
  Administered 2019-11-06: 40 meq via ORAL
  Filled 2019-11-06: qty 2

## 2019-11-06 MED ORDER — SODIUM CHLORIDE 0.9 % IV BOLUS
1000.0000 mL | Freq: Once | INTRAVENOUS | Status: AC
  Administered 2019-11-06: 1000 mL via INTRAVENOUS

## 2019-11-06 MED ORDER — SODIUM CHLORIDE 0.9% FLUSH: 3.0000 mL | Freq: Once | INTRAVENOUS | Status: DC

## 2019-11-06 MED ORDER — OMEPRAZOLE 10 MG PO CPDR: 10.0000 mg | DELAYED_RELEASE_CAPSULE | Freq: Every day | ORAL | 5 refills | Status: DC

## 2019-11-06 MED ORDER — ONDANSETRON 4 MG PO TBDP: 4.0000 mg | ORAL_TABLET | Freq: Three times a day (TID) | ORAL | 0 refills | Status: DC | PRN

## 2019-11-06 MED ORDER — POTASSIUM CHLORIDE 10 MEQ/100ML IV SOLN
10.0000 meq | Freq: Once | INTRAVENOUS | Status: AC
  Administered 2019-11-06: 10 meq via INTRAVENOUS
  Filled 2019-11-06: qty 100

## 2019-11-06 NOTE — ED Provider Notes (Signed)
[facility] Emergency Department Provider Note  ____________________________________________  Time seen: Approximately 1:37 PM  I have reviewed the triage vital signs and the nursing notes.   HISTORY  Chief Complaint Emesis    HPI Casey Meyer is a 39 y.o. female who presents the emergency department complaining of generalized body aches, nausea, emesis, epigastric pain.  Patient states that she does not have a primary care, she states that she has been told that she was anemic, that her kidney function was declining and she would likely need dialysis, and that she had a stomach ulcer.  Patient states that she has chronic generalized body aches and pain.  She takes Excedrin on a daily basis for her pain.  Patient states that she has experienced periods of significant nausea and emesis.  She states that it will cause her to be anorexic for days on end.  Patient states that she was told that she had a stomach ulcer, placed on a medication for a month which had improved her symptoms for a year.  She states that she has had increased anxiety and now is having a "boiling" sensation in her epigastric region.  This is causing her to be anorexic x5 days.  Patient states that she can maintain fluids but cannot eat.  No fevers or chills, no URI symptoms.  Patient states that her anxiety is very high and she does not leave the house because of the COVID-19 pandemic.  She has not been around anybody sick recently.  Patient is still taking Excedrin daily even though she has been told the NSAID could irritate her stomach.  Patient denies any hematic emesis.  Chronic diarrhea but no constipation.  No bloody diarrhea.  No dysuria, polyuria, hematuria.         Past Medical History:  Diagnosis Date  . Bipolar 1 disorder (HCC)     There are no problems to display for this patient.   Past Surgical History:  Procedure Laterality Date  . NOSE SURGERY      Prior to Admission  medications   Medication Sig Start Date End Date Taking? Authorizing Provider  aspirin-acetaminophen-caffeine (EXCEDRIN MIGRAINE) [phone] MG tablet Take 1-2 tablets by mouth every 6 (six) hours as needed for headache.   Yes [provider]  omeprazole (PRILOSEC) 10 MG capsule Take 1 capsule (10 mg total) by mouth daily. 11/06/19   Cala Kruckenberg, Delorise Royals, PA-C  ondansetron (ZOFRAN-ODT) 4 MG disintegrating tablet Take 1 tablet (4 mg total) by mouth every 8 (eight) hours as needed for nausea or vomiting. 11/06/19   Jamiya Nims, Delorise Royals, PA-C  potassium chloride 20 MEQ TBCR Take 20 mEq by mouth daily for 5 days. 11/06/19 11/11/19  Allante Whitmire, Delorise Royals, PA-C    Allergies Bee venom  Family History  Problem Relation Age of Onset  . Cancer Father     Social History Social History   Tobacco Use  . Smoking status: Current Every Day Smoker  . Smokeless tobacco: Never Used  Substance Use Topics  . Alcohol use: No  . Drug use: No     Review of Systems  Constitutional: No fever/chills Eyes: No visual changes. No discharge ENT: No upper respiratory complaints. Cardiovascular: no chest pain. Respiratory: no cough. No SOB. Gastrointestinal: Epigastric "pulling" sensation with nausea and emesis..  Chronic diarrhea.  No constipation. Genitourinary: Negative for dysuria. No hematuria Musculoskeletal: Generalized chronic body aches.  No change from baseline. Skin: Negative for rash, abrasions, lacerations, ecchymosis. Neurological: Negative for headaches, focal  weakness or numbness. 10-point ROS otherwise negative.  ____________________________________________   PHYSICAL EXAM:  VITAL SIGNS: ED Triage Vitals  Enc Vitals Group     BP 11/06/19 1150 (!) 150/75     Pulse Rate 11/06/19 1150 83     Resp 11/06/19 1150 16     Temp 11/06/19 1150 98.5 F (36.9 C)     Temp Source 11/06/19 1150 Oral     SpO2 11/06/19 1150 97 %     Weight 11/06/19 1151 169 lb (76.7 kg)     Height  11/06/19 1151 5\' 2"  (1.575 m)     Head Circumference --      Peak Flow --      Pain Score 11/06/19 1151 0     Pain Loc --      Pain Edu? --      Excl. in GC? --      Constitutional: Alert and oriented. Well appearing and in no acute distress. Eyes: Conjunctivae are normal. PERRL. EOMI. Head: Atraumatic. ENT:      Ears:       Nose: No congestion/rhinnorhea.      Mouth/Throat: Mucous membranes are moist.  Neck: No stridor.   Hematological/Lymphatic/Immunilogical: No cervical lymphadenopathy. Cardiovascular: Normal rate, regular rhythm. Normal S1 and S2.  Good peripheral circulation. Respiratory: Normal respiratory effort without tachypnea or retractions. Lungs CTAB. Good air entry to the bases with no decreased or absent breath sounds. Gastrointestinal: Bowel sounds 4 quadrants.  Soft to palpation all quadrants.  Mild tenderness in the epigastric region just inferior to the xiphoid process.. No guarding or rigidity. No palpable masses. No distention. No CVA tenderness. Musculoskeletal: Full range of motion to all extremities. No gross deformities appreciated. Neurologic:  Normal speech and language. No gross focal neurologic deficits are appreciated.  Skin:  Skin is warm, dry and intact. No rash noted. Psychiatric: Mood and affect are normal. Speech and behavior are normal. Patient exhibits appropriate insight and judgement.   ____________________________________________   LABS (all labs ordered are listed, but only abnormal results are displayed)  Labs Reviewed  COMPREHENSIVE METABOLIC PANEL - Abnormal; Notable for the following components:      Result Value   Potassium 2.9 (*)    CO2 20 (*)    Glucose, Bld 105 (*)    Total Bilirubin 1.9 (*)    All other components within normal limits  CBC - Abnormal; Notable for the following components:   WBC 12.3 (*)    All other components within normal limits  URINALYSIS, COMPLETE (UACMP) WITH MICROSCOPIC - Abnormal; Notable for the  following components:   Color, Urine AMBER (*)    APPearance HAZY (*)    Hgb urine dipstick SMALL (*)    Ketones, ur 5 (*)    Protein, ur 100 (*)    Bacteria, UA RARE (*)    All other components within normal limits  T4, FREE - Abnormal; Notable for the following components:   Free T4 1.27 (*)    All other components within normal limits  LIPASE, BLOOD  CK  PREGNANCY, URINE  TSH  T3  POC URINE PREG, ED   ____________________________________________  EKG   ____________________________________________  RADIOLOGY  No results found.  ____________________________________________    PROCEDURES  Procedure(s) performed:    Procedures    Medications  potassium chloride 10 mEq in 100 mL IVPB (0 mEq Intravenous Stopped 11/06/19 1521)  sodium chloride 0.9 % bolus 1,000 mL (0 mLs Intravenous Stopped 11/06/19 1523)  potassium chloride  SA (KLOR-CON) CR tablet 40 mEq (40 mEq Oral Given 11/06/19 1418)  ondansetron (ZOFRAN) injection 4 mg (4 mg Intravenous Given 11/06/19 1418)     ____________________________________________   INITIAL IMPRESSION / ASSESSMENT AND PLAN / ED COURSE  Pertinent labs & imaging results that were available during my care of the patient were reviewed by me and considered in my medical decision making (see chart for details).  Review of the [place] CSRS was performed in accordance of the [facility] prior to dispensing any controlled drugs.  Clinical Course as of Nov 05 2121  Sun Nov 06, 2019  1405 Patient presents the emergency department with complaints of acute on chronic issues.  Patient has a history of generalized body aches, malaise.  She states that this is ongoing and never alleviated no matter what she does.  Patient is also complaining of nausea, emesis and anorexia x5 days.  Patient states that she has been told that she has a stomach ulcer but continues to use Excedrin on a regular daily basis.  Patient states that she had been placed on what apparently  was a PPI approximately a year ago.  This significantly improved her symptoms over the past year but they have now returned.  She denies any recent sick contacts that she does not leave the house given the COVID-19 pandemic.  Overall exam was reassuring.  Labs are overall reassuring.  Slight elevation in white blood cell count, patient has some protein in her urine but she states that this is constant.  Patient does have mild hypokalemia.  I will give the patient fluids, potassium at this time.  I will check a CK and ensure a negative pregnancy test.  I suspect that these issues are likely   [JC]    Clinical Course User Index [JC] Izaiha Lo, Delorise Royals, PA-C          Patient's diagnosis is consistent with chronic gastric ulcer, nausea vomiting diarrhea. Patient presents with history of gastric ulcer, nausea vomiting diarrhea.  Patient states that she takes NSAIDs every day for chronic musculoskeletal pain.  Patient presented to the emergency department complaining of return of gastric ulcer symptoms.  Patient states that she had been treated successfully a year ago with a PPI.  After stopping the medication she had done well for almost a year until she had a return of symptoms over the past several days.  When she has the symptoms she states that she is unable to eat to eat.  She does maintain good oral hydration.  Overall exam is reassuring.  Findings were concerning for return of ulcer symptoms.  There was no significant tenderness on exam, vitals were stable, labs are reassuring.  I discussed imaging versus treatment and at this time patient opts for treatment which I feel is reasonable given her chronic history, no significant acute findings on exam or labs.  Patient will be treated with antiemetics, omeprazole at home.  Patient did have hypokalemia and was given IV and oral replacement as well as a prescription for 5 days of potassium.  I have recommended that the patient follow-up with GI and primary  care.  Patient states that she is going to see her primary care in regards to her thyroid.  Thyroid labs have been drawn but had not returned prior to discharge..Follow-up with primary care and GI as needed.  Return precautions discussed with the patient.  Patient is given ED precautions to return to the ED for any worsening or new symptoms.  ____________________________________________  FINAL CLINICAL IMPRESSION(S) / ED DIAGNOSES  Final diagnoses:  Chronic gastric ulcer, unspecified whether gastric ulcer hemorrhage or perforation present  Nausea vomiting and diarrhea      NEW MEDICATIONS STARTED DURING THIS VISIT:  ED Discharge Orders         Ordered    omeprazole (PRILOSEC) 10 MG capsule  Daily     11/06/19 1702    potassium chloride 20 MEQ TBCR  Daily     11/06/19 1702    ondansetron (ZOFRAN-ODT) 4 MG disintegrating tablet  Every 8 hours PRN     11/06/19 1702              This chart was dictated using voice recognition software/Dragon. Despite best efforts to proofread, errors can occur which can change the meaning. Any change was purely unintentional.    Darletta Moll, PA-C 11/06/19 2123    Harvest Dark, MD 11/07/19 [id number]

## 2019-11-06 NOTE — ED Triage Notes (Signed)
FIRST NURSE NOTE- pt c/o fatigue and that she has not eaten for 5 days.  States " they told me my kidneys are bad and the next step is dialysis".  Ambulatory, NAD.

## 2019-11-06 NOTE — ED Notes (Addendum)
Pt was seen in the ED a year and says she was told to follow up to assess need for dialysis. When pt was at follow up appointment in July, she states they told her she was anemic and "cells showed infection."  Pt c/o body aches, fatigue, anorexia, and nausea for several months.  Pt is AOx4, NAD noted.

## 2019-11-06 NOTE — ED Triage Notes (Signed)
Pt to ED via POV c/o emesis. Pt states that she is unable to keep anything down. Pt states that she was told last year that her kidneys were failing and that the next step is dialysis. Pt states that her whole body has felt bruised for much and she feels fatigue. Pt is in NAD at this time.

## 2019-11-08 ENCOUNTER — Telehealth: Payer: Self-pay | Admitting: Emergency Medicine

## 2019-11-08 LAB — T3: T3, Total: 185 ng/dL — ABNORMAL HIGH (ref 71–180)

## 2019-11-08 NOTE — Telephone Encounter (Signed)
Called patient to inform her that thyroid tests were complete and to assure she was going to follow up with pcp/  No answer and no voicemail available.

## 2020-03-16 ENCOUNTER — Other Ambulatory Visit: Payer: Self-pay

## 2020-03-16 ENCOUNTER — Emergency Department
Admission: EM | Admit: 2020-03-16 | Discharge: 2020-03-16 | Disposition: A | Discharge status: Home / Self Care | Payer: Medicaid Other | Attending: Emergency Medicine | Admitting: Emergency Medicine

## 2020-03-16 ENCOUNTER — Encounter: Payer: Self-pay | Admitting: Emergency Medicine

## 2020-03-16 ENCOUNTER — Emergency Department: Payer: Medicaid Other

## 2020-03-16 DIAGNOSIS — E876 Hypokalemia: Secondary | ICD-10-CM | POA: Insufficient documentation

## 2020-03-16 DIAGNOSIS — R1032 Left lower quadrant pain: Secondary | ICD-10-CM | POA: Diagnosis not present

## 2020-03-16 DIAGNOSIS — O09511 Supervision of elderly primigravida, first trimester: Secondary | ICD-10-CM | POA: Insufficient documentation

## 2020-03-16 DIAGNOSIS — O99281 Endocrine, nutritional and metabolic diseases complicating pregnancy, first trimester: Secondary | ICD-10-CM | POA: Diagnosis not present

## 2020-03-16 DIAGNOSIS — Z3A01 Less than 8 weeks gestation of pregnancy: Secondary | ICD-10-CM | POA: Insufficient documentation

## 2020-03-16 DIAGNOSIS — O26891 Other specified pregnancy related conditions, first trimester: Secondary | ICD-10-CM | POA: Diagnosis present

## 2020-03-16 DIAGNOSIS — Z3491 Encounter for supervision of normal pregnancy, unspecified, first trimester: Secondary | ICD-10-CM

## 2020-03-16 LAB — CBC
HCT: 37.6 % (ref 36.0–46.0)
Hemoglobin: 13.2 g/dL (ref 12.0–15.0)
MCH: 31.7 pg (ref 26.0–34.0)
MCHC: 35.1 g/dL (ref 30.0–36.0)
MCV: 90.2 fL (ref 80.0–100.0)
Platelets: 210 10*3/uL (ref 150–400)
RBC: 4.17 MIL/uL (ref 3.87–5.11)
RDW: 12.9 % (ref 11.5–15.5)
WBC: 10.6 10*3/uL — ABNORMAL HIGH (ref 4.0–10.5)
nRBC: 0 % (ref 0.0–0.2)

## 2020-03-16 LAB — COMPREHENSIVE METABOLIC PANEL
ALT: 13 U/L (ref 0–44)
AST: 16 U/L (ref 15–41)
Albumin: 4.4 g/dL (ref 3.5–5.0)
Alkaline Phosphatase: 42 U/L (ref 38–126)
Anion gap: 11 (ref 5–15)
BUN: 8 mg/dL (ref 6–20)
CO2: 23 mmol/L (ref 22–32)
Calcium: 9.1 mg/dL (ref 8.9–10.3)
Chloride: 104 mmol/L (ref 98–111)
Creatinine, Ser: 0.64 mg/dL (ref 0.44–1.00)
GFR calc Af Amer: >60 mL/min (ref >60)
GFR calc non Af Amer: >60 mL/min (ref >60)
Glucose, Bld: 112 mg/dL — ABNORMAL HIGH (ref 70–99)
Potassium: 3.2 mmol/L — ABNORMAL LOW (ref 3.5–5.1)
Sodium: 138 mmol/L (ref 135–145)
Total Bilirubin: 0.7 mg/dL (ref 0.3–1.2)
Total Protein: 7.4 g/dL (ref 6.5–8.1)

## 2020-03-16 LAB — URINALYSIS, COMPLETE (UACMP) WITH MICROSCOPIC
Bacteria, UA: NONE SEEN
Bilirubin Urine: NEGATIVE
Glucose, UA: NEGATIVE mg/dL
Hgb urine dipstick: NEGATIVE
Ketones, ur: NEGATIVE mg/dL
Leukocytes,Ua: NEGATIVE
Nitrite: NEGATIVE
Protein, ur: NEGATIVE mg/dL
Specific Gravity, Urine: 1.027 (ref 1.005–1.030)
pH: 5 (ref 5.0–8.0)

## 2020-03-16 LAB — POCT PREGNANCY, URINE: Preg Test, Ur: POSITIVE — AB

## 2020-03-16 LAB — HCG, QUANTITATIVE, PREGNANCY: hCG, Beta Chain, Quant, S: 3031 m[IU]/mL — ABNORMAL HIGH (ref <5)

## 2020-03-16 MED ORDER — ONDANSETRON 4 MG PO TBDP
4.0000 mg | ORAL_TABLET | Freq: Once | ORAL | Status: AC
  Administered 2020-03-16: 4 mg via ORAL
  Filled 2020-03-16: qty 1

## 2020-03-16 MED ORDER — ONDANSETRON 4 MG PO TBDP: 4.0000 mg | ORAL_TABLET | Freq: Three times a day (TID) | ORAL | 0 refills | Status: DC | PRN

## 2020-03-16 MED ORDER — POTASSIUM CHLORIDE ER 8 MEQ PO TBCR: 8.0000 meq | EXTENDED_RELEASE_TABLET | Freq: Every day | ORAL | 0 refills | Status: DC

## 2020-03-16 NOTE — ED Triage Notes (Signed)
Says back left pain says dysurea.  Voiding in small amounts.

## 2020-03-16 NOTE — ED Notes (Signed)
Pt transported to US

## 2020-03-16 NOTE — ED Notes (Signed)
See triage note States she developed pain to left lower back and LLQ couple of days ago  No fever or n/v   She is having some dysuria

## 2020-03-16 NOTE — ED Provider Notes (Signed)
[facility] Emergency Department Provider Note  ____________________________________________   First MD Initiated Contact with Patient 03/16/20 1211     (approximate)  I have reviewed the triage vital signs and the nursing notes.   HISTORY  Chief Complaint Dysuria and Back Pain   HPI Casey Meyer is a 39 y.o. female presents to the ED with complaint of left lower back and left lower quadrant pain for several days.  Patient denies any fever, nausea or vomiting.  Patient states she has had some dysuria but is unaware of any hematuria.  She denies any history of stones.  She reports that she is voiding in small amounts.  Patient is currently using Depo injections for birth control however she reports that she did miss the last 1 due to her husband being involved in MVC.  Currently she rates her pain as 7 out of 10.      Past Medical History:  Diagnosis Date   Bipolar 1 disorder (HCC)     There are no problems to display for this patient.   Past Surgical History:  Procedure Laterality Date   NOSE SURGERY      Prior to Admission medications   Medication Sig Start Date End Date Taking? Authorizing Provider  aspirin-acetaminophen-caffeine (EXCEDRIN MIGRAINE) [phone] MG tablet Take 1-2 tablets by mouth every 6 (six) hours as needed for headache.    [provider]  omeprazole (PRILOSEC) 10 MG capsule Take 1 capsule (10 mg total) by mouth daily. 11/06/19   Cuthriell, Delorise Royals, PA-C  ondansetron (ZOFRAN ODT) 4 MG disintegrating tablet Take 1 tablet (4 mg total) by mouth every 8 (eight) hours as needed for nausea or vomiting. 03/16/20   Tommi Rumps, PA-C  potassium chloride (KLOR-CON) 8 MEQ tablet Take 1 tablet (8 mEq total) by mouth daily. 03/16/20   Tommi Rumps, PA-C    Allergies Bee venom  Family History  Problem Relation Age of Onset   Cancer Father     Social History Social History   Tobacco Use   Smoking status:  Current Some Day Smoker   Smokeless tobacco: Never Used  Substance Use Topics   Alcohol use: No   Drug use: No    Review of Systems Constitutional: No fever/chills Eyes: No visual changes. Cardiovascular: Denies chest pain. Respiratory: Denies shortness of breath. Gastrointestinal: Positive abdominal pain, left lower quadrant.  Positive nausea, no vomiting.  No diarrhea.  No constipation. Genitourinary: Positive for dysuria. Musculoskeletal: Positive for left lower back pain. Skin: Negative for rash. Neurological: Negative for headaches, focal weakness or numbness. ____________________________________________   PHYSICAL EXAM:  VITAL SIGNS: ED Triage Vitals  Enc Vitals Group     BP 03/16/20 0946 (!) 143/84     Pulse Rate 03/16/20 0946 96     Resp 03/16/20 0946 16     Temp 03/16/20 0946 98.1 F (36.7 C)     Temp Source 03/16/20 0946 Oral     SpO2 03/16/20 0946 98 %     Weight 03/16/20 0945 150 lb (68 kg)     Height 03/16/20 0945 5\' 2"  (1.575 m)     Head Circumference --      Peak Flow --      Pain Score 03/16/20 0944 7     Pain Loc --      Pain Edu? --      Excl. in GC? --     Constitutional: Alert and oriented. Well appearing and in no acute distress.  Eyes: Conjunctivae are normal.  Head: Atraumatic. Neck: No stridor.   Cardiovascular: Normal rate, regular rhythm. Grossly normal heart sounds.  Good peripheral circulation. Respiratory: Normal respiratory effort.  No retractions. Lungs CTAB. Gastrointestinal: Soft, tender left lower quadrant.  No distention.  Bowel sounds normoactive x4 quadrants.  Mildly positive left CVA tenderness. Musculoskeletal: Moves upper and lower extremities with any difficulty.  Normal gait was noted. Neurologic:  Normal speech and language. No gross focal neurologic deficits are appreciated. No gait instability. Skin:  Skin is warm, dry and intact. No rash noted. Psychiatric: Mood and affect are normal. Speech and behavior are  normal.  ____________________________________________   LABS (all labs ordered are listed, but only abnormal results are displayed)  Labs Reviewed  COMPREHENSIVE METABOLIC PANEL - Abnormal; Notable for the following components:      Result Value   Potassium 3.2 (*)    Glucose, Bld 112 (*)    All other components within normal limits  CBC - Abnormal; Notable for the following components:   WBC 10.6 (*)    All other components within normal limits  URINALYSIS, COMPLETE (UACMP) WITH MICROSCOPIC - Abnormal; Notable for the following components:   Color, Urine YELLOW (*)    APPearance CLOUDY (*)    All other components within normal limits  HCG, QUANTITATIVE, PREGNANCY - Abnormal; Notable for the following components:   hCG, Beta Chain, Quant, S 3,031 (*)    All other components within normal limits  POCT PREGNANCY, URINE - Abnormal; Notable for the following components:   Preg Test, Ur POSITIVE (*)    All other components within normal limits  POC URINE PREG, ED    RADIOLOGY  Official radiology report(s): US OB LESS THAN 14 WEEKS WITH OB TRANSVAGINAL  Result Date: 03/16/2020 CLINICAL DATA:  LEFT lower quadrant pain for 2 days, dysuria, quantitative beta hCG = 3,031 EXAM: OBSTETRIC <14 WK [place] AND TRANSVAGINAL OB US TECHNIQUE: Both transabdominal and transvaginal ultrasound examinations were performed for complete evaluation of the gestation as well as the maternal uterus, adnexal regions, and pelvic cul-de-sac. Transvaginal technique was performed to assess early pregnancy. COMPARISON:  None FINDINGS: Intrauterine gestational sac: Present, single Yolk sac:  Not visualized Embryo:  Not visualized Cardiac Activity: N/A Heart Rate: N/A  bpm MSD: 4.3 mm   5 w   1 d Subchorionic hemorrhage:  None visualized. Maternal uterus/adnexae: Uterus retroverted, otherwise unremarkable. LEFT ovary normal size and morphology 2.6 x 1.6 x 2.3 cm. RIGHT ovary measures 4.1 x 3.2 x 3.0 cm and contains a  complex cyst 3.4 x 2.9 x 2.9 cm containing lacy internal septations question hemorrhagic cyst. Small amount of free pelvic fluid. No additional adnexal masses. IMPRESSION: Tiny gestational sac seen within the uterus; no fetal pole visualized to establish viability. Complex cyst RIGHT ovary 3.4 cm greatest diameter, question hemorrhagic cyst. Small amount of nonspecific free pelvic fluid. Electronically Signed   By: Ulyses Southward M.D.   On: 03/16/2020 14:59    ____________________________________________   PROCEDURES  Procedure(s) performed (including Critical Care):  Procedures   ____________________________________________   INITIAL IMPRESSION / ASSESSMENT AND PLAN / ED COURSE  As part of my medical decision making, I reviewed the following data within the electronic MEDICAL RECORD NUMBER Notes from prior ED visits and [place] Controlled Substance Database  Casey Meyer was evaluated in Emergency Department on 03/16/2020 for the symptoms described in the history of present illness. She was evaluated in the context of the global COVID-19 pandemic, which necessitated  consideration that the patient might be at risk for infection with the SARS-CoV-2 virus that causes COVID-19. Institutional protocols and algorithms that pertain to the evaluation of patients at risk for COVID-19 are in a state of rapid change based on information released by regulatory bodies including the CDC and federal and state organizations. These policies and algorithms were followed during the patient's care in the ED.  39 year old female presents to the ED with complaint of left lower quadrant pain and low back pain for several days.  Patient reports having some dysuria and voiding in small amounts.  Urinalysis was negative for infection but patient had a positive pregnancy test.  Beta hCG was also positive and ultrasound showed a single IUP at 5 weeks 1 day.  Patient was made aware that she was pregnant.  She plans to follow-up with  her OB/GYN that is scared for her prenatal needs in the past.  Patient was reassured.  A prescription for Zofran for nausea was sent to her pharmacy and also she has some mild hypokalemia with a history of hypokalemia in the past.  A prescription for potassium was sent to her pharmacy as well and she is encouraged to eat potassium rich foods.  She is return to the emergency department if any severe worsening of her symptoms or urgent concerns.  ____________________________________________   FINAL CLINICAL IMPRESSION(S) / ED DIAGNOSES  Final diagnoses:  First trimester pregnancy  Hypokalemia     ED Discharge Orders         Ordered    potassium chloride (KLOR-CON) 8 MEQ tablet  Daily        03/16/20 1514    ondansetron (ZOFRAN ODT) 4 MG disintegrating tablet  Every 8 hours PRN        03/16/20 1514           Note:  This document was prepared using Dragon voice recognition software and may include unintentional dictation errors.    Tommi Rumps, PA-C 03/16/20 1557    Arnaldo Natal, MD 03/16/20 1620

## 2020-03-16 NOTE — Discharge Instructions (Signed)
Follow-up with your OB/GYN.  Call tomorrow to make an appointment for continued prenatal care.  Also your lab work showed mild decreased potassium which you have had in the past.  A prescription for potassium tablets was sent to your pharmacy for the next 7 days.  Also eat foods that are rich in potassium to help with this.  Zofran as needed for nausea was sent to the pharmacy as well.  Avoid anti-inflammatories as you are pregnant.  You may take Tylenol if needed for pain or headache.  Your ultrasound shows that you are 6 weeks and 1 day.

## 2020-04-04 ENCOUNTER — Encounter: Payer: Self-pay | Admitting: Radiology

## 2020-04-17 ENCOUNTER — Encounter: Payer: Medicaid Other | Admitting: Family Medicine

## 2020-05-01 ENCOUNTER — Encounter: Payer: Medicaid Other | Admitting: Obstetrics and Gynecology

## 2020-05-22 ENCOUNTER — Other Ambulatory Visit: Payer: Self-pay

## 2020-05-22 ENCOUNTER — Other Ambulatory Visit (HOSPITAL_COMMUNITY)
Admission: RE | Admit: 2020-05-22 | Discharge: 2020-05-22 | Disposition: A | Discharge status: Home / Self Care | Payer: Medicaid Other | Source: Ambulatory Visit | Attending: Obstetrics and Gynecology | Admitting: Obstetrics and Gynecology

## 2020-05-22 ENCOUNTER — Ambulatory Visit (INDEPENDENT_AMBULATORY_CARE_PROVIDER_SITE_OTHER): Payer: Medicaid Other | Admitting: Obstetrics and Gynecology

## 2020-05-22 ENCOUNTER — Encounter: Payer: Self-pay | Admitting: Obstetrics and Gynecology

## 2020-05-22 VITALS — BP 108/72 | HR 96 | Wt 153.0 lb

## 2020-05-22 DIAGNOSIS — O09529 Supervision of elderly multigravida, unspecified trimester: Secondary | ICD-10-CM | POA: Insufficient documentation

## 2020-05-22 DIAGNOSIS — O09522 Supervision of elderly multigravida, second trimester: Secondary | ICD-10-CM

## 2020-05-22 DIAGNOSIS — O0990 Supervision of high risk pregnancy, unspecified, unspecified trimester: Secondary | ICD-10-CM | POA: Diagnosis present

## 2020-05-22 DIAGNOSIS — Z8616 Personal history of COVID-19: Secondary | ICD-10-CM

## 2020-05-22 DIAGNOSIS — O09299 Supervision of pregnancy with other poor reproductive or obstetric history, unspecified trimester: Secondary | ICD-10-CM

## 2020-05-22 DIAGNOSIS — N83291 Other ovarian cyst, right side: Secondary | ICD-10-CM | POA: Insufficient documentation

## 2020-05-22 HISTORY — DX: Personal history of COVID-19: Z86.16

## 2020-05-22 MED ORDER — ASPIRIN EC 81 MG PO TBEC: 81.0000 mg | DELAYED_RELEASE_TABLET | Freq: Every day | ORAL | 3 refills | Status: DC

## 2020-05-22 NOTE — Progress Notes (Signed)
Was on depo, last injection was in April  Last pap was in 2018, when mirena was removed

## 2020-05-22 NOTE — Progress Notes (Signed)
New OB Note  05/22/2020   Clinic: [facility] for [facility]-  Chief Complaint: NOB  Transfer of Care Patient: no  History of Present Illness: Casey Meyer is a 39 y.o. [id number] @ 14/5 weeks (EDC 4/28 , based on Patient's last menstrual period was 02/01/2020 (approximate).).  Preg complicated by has Supervision of high risk pregnancy, antepartum; Complex cyst of right ovary; AMA (advanced maternal age) multigravida 35+; History of COVID-19; and History of maternal cervical laceration, currently pregnant on their problem list.   Any events prior to today's visit: went to ed on 8/27 for dysuria and back pain Her periods were: irregular since stopping depo April 2021 She was using no method when she conceived.  She has Negative signs or symptoms of nausea/vomiting of pregnancy. She has Negative signs or symptoms of miscarriage or preterm labor On any medications around the time she conceived/early pregnancy: No   ROS: A 12-point review of systems was performed and negative, except as stated in the above HPI.  OBGYN History: As per HPI. OB History  Gravida Para Term Preterm AB Living  5 2 2   2 2   SAB TAB Ectopic Multiple Live Births  2       2    # Outcome Date GA Lbr Len/2nd Weight Sex Delivery Anes PTL Lv  5 Current           4 Term 02/13/12    M Vag-Spont   LIV  3 Term 08/19/09    F Vag-Spont   LIV  2 SAB 09/19/06          1 SAB 03/16/05            Any issues with any prior pregnancies: cervical laceration with delivery in 2013 Prior children are healthy, doing well, and without any problems or issues: yes History of pap smears: Yes. Last pap smear unknown    Past Medical History: Past Medical History:  Diagnosis Date  . Bipolar 1 disorder [facility])     Past Surgical History: Past Surgical History:  Procedure Laterality Date  . DILATION AND CURETTAGE OF UTERUS    . NOSE SURGERY      Family History:  Family History  Problem Relation Age of Onset  . Cancer Father     Social History:  Social History   Socioeconomic History  . Marital status: Married    Spouse name: Not on file  . Number of children: Not on file  . Years of education: Not on file  . Highest education level: Not on file  Occupational History  . Not on file  Tobacco Use  . Smoking status: Current Some Day Smoker  . Smokeless tobacco: Never Used  Vaping Use  . Vaping Use: Never used  Substance and Sexual Activity  . Alcohol use: No  . Drug use: No  . Sexual activity: Yes    Birth control/protection: Injection  Other Topics Concern  . Not on file  Social History Narrative  . Not on file   Social Determinants of Health   Financial Resource Strain:   . Difficulty of Paying Living Expenses: Not on file  Food Insecurity:   . Worried About [facility] in the Last Year: Not on file  . Ran Out of Food in the Last Year: Not on file  Transportation Needs:   . Lack of Transportation (Medical): Not on file  . Lack of Transportation (Non-Medical): Not on file  Physical Activity:   . Days of  Exercise per Week: Not on file  . Minutes of Exercise per Session: Not on file  Stress:   . Feeling of Stress : Not on file  Social Connections:   . Frequency of Communication with Friends and Family: Not on file  . Frequency of Social Gatherings with Friends and Family: Not on file  . Attends Religious Services: Not on file  . Active Member of Clubs or Organizations: Not on file  . Attends Banker Meetings: Not on file  . Marital Status: Not on file  Intimate Partner Violence:   . Fear of Current or Ex-Partner: Not on file  . Emotionally Abused: Not on file  . Physically Abused: Not on file  . Sexually Abused: Not on file    Allergy: Allergies  Allergen Reactions  . Bee Venom Swelling    Health Maintenance:  Mammogram Up to Date: not applicable  Current Outpatient Medications: Prenatal vitamin  Physical Exam:   BP 108/72   Pulse 96   Wt 153 lb  (69.4 kg)   LMP 02/01/2020 (Approximate)   BMI 27.98 kg/m  Body mass index is 27.98 kg/m. Contractions: Irritability Vag. Bleeding: None. Fundal height: 14 FHTs: 140s-150s  General appearance: Well nourished, well developed female in no acute distress.  Neck:  Supple, normal appearance, and no thyromegaly  Cardiovascular: S1, S2 normal, no murmur, rub or gallop, regular rate and rhythm Respiratory:  Clear to auscultation bilateral. Normal respiratory effort Abdomen: positive bowel sounds and no masses, hernias; diffusely non tender to palpation, non distended Breasts: pt denies any breast s/s. Neuro/Psych:  Normal mood and affect.  Skin:  Warm and dry.  Lymphatic:  No inguinal lymphadenopathy.   Pelvic exam: is not limited by body habitus EGBUS: within normal limits, Vagina: within normal limits and with no blood in the vault, Cervix: normal appearing cervix without discharge or lesions, closed/long/high, Uterus:  enlarged, c/w 14-16 week size, and Adnexa:  normal adnexa and no mass, fullness, tenderness  Laboratory: none  Imaging:  Bedside u/s c/w dating. SLIUP, normal FHR, subj normal AF. +FM  Assessment: pt doing well  Plan: 1. Supervision of high risk pregnancy, antepartum Routine care. Offer afp nv. Anatomy u/s to be scheduled.  Would like more information on vaccination which was given to her  COVID-19 Vaccine Counseling: The patient was counseled on the potential benefits and lack of known risks of COVID vaccination, during pregnancy and breastfeeding, during today's visit. The patient's questions and concerns were addressed today, including safety of the vaccination and potential side effects as they have been published by ACOG and SMFM. The patient has been informed that there have not been any documented vaccine related injuries, deaths or birth defects to infant or mom after receiving the COVID-19 vaccine to date. The patient has been made aware that although she is  not at increased risk of contracting COVID-19 during pregnancy, she is at increased risk of developing severe disease and complications if she contracts COVID-19 while pregnant. All patient questions were addressed during our visit today. The patient is not planning to get vaccinated at this time.   - CBC/D/Plt+RPR+Rh+ABO+Rub Ab... - Culture, OB Urine - Cytology - PAP - Genetic Screening - US MFM OB DETAIL +14 WK; Future - Comprehensive metabolic panel - Protein / creatinine ratio, urine - Hemoglobin A1c  2. Complex cyst of right ovary Seen in the ED. F/u at anatomy u/s  3. Multigravida of advanced maternal age in second trimester See above  4. History  of COVID-19 Late sept 2021  5. History of maternal cervical laceration, currently pregnant Well healed on exam. D/w her risk it may open up again with labor but likely low risk for this and bleeding issues. Okay for vag delivery  Problem list reviewed and updated.  Follow up in 4 weeks.  The nature of [facility] with multiple MDs and other Advanced Practice Providers was explained to patient; also emphasized that residents, students are part of our team.  >50% of 30 min visit spent on counseling and coordination of care.     Cornelia Copa MD Attending Center for [facility])

## 2020-05-22 NOTE — Patient Instructions (Signed)
   Refer to this link for information on covid vaccination and pregnancy  [email or web address]

## 2020-05-23 LAB — PROTEIN / CREATININE RATIO, URINE
Creatinine, Urine: 259.2 mg/dL
Protein, Ur: 27.5 mg/dL
Protein/Creat Ratio: 106 mg/g creat (ref 0–200)

## 2020-05-23 LAB — CBC/D/PLT+RPR+RH+ABO+RUB AB...
Antibody Screen: NEGATIVE
Basophils Absolute: 0.1 10*3/uL (ref 0.0–0.2)
Basos: 1 %
EOS (ABSOLUTE): 0.5 10*3/uL — ABNORMAL HIGH (ref 0.0–0.4)
Eos: 3 %
HCV Ab: <0.1 s/co ratio (ref 0.0–0.9)
HIV Screen 4th Generation wRfx: NONREACTIVE
Hematocrit: 35 % (ref 34.0–46.6)
Hemoglobin: 12.2 g/dL (ref 11.1–15.9)
Hepatitis B Surface Ag: NEGATIVE
Immature Grans (Abs): 0.1 10*3/uL (ref 0.0–0.1)
Immature Granulocytes: 1 %
Lymphocytes Absolute: 3.3 10*3/uL — ABNORMAL HIGH (ref 0.7–3.1)
Lymphs: 20 %
MCH: 32.4 pg (ref 26.6–33.0)
MCHC: 34.9 g/dL (ref 31.5–35.7)
MCV: 93 fL (ref 79–97)
Monocytes Absolute: 1 10*3/uL — ABNORMAL HIGH (ref 0.1–0.9)
Monocytes: 6 %
Neutrophils Absolute: 11.5 10*3/uL — ABNORMAL HIGH (ref 1.4–7.0)
Neutrophils: 69 %
Platelets: 258 10*3/uL (ref 150–450)
RBC: 3.77 x10E6/uL (ref 3.77–5.28)
RDW: 13.5 % (ref 11.7–15.4)
RPR Ser Ql: NONREACTIVE
Rh Factor: POSITIVE
Rubella Antibodies, IGG: 1.54 index (ref >0.99)
WBC: 16.5 10*3/uL — ABNORMAL HIGH (ref 3.4–10.8)

## 2020-05-23 LAB — HEMOGLOBIN A1C
Est. average glucose Bld gHb Est-mCnc: 97 mg/dL
Hgb A1c MFr Bld: 5 % (ref 4.8–5.6)

## 2020-05-23 LAB — COMPREHENSIVE METABOLIC PANEL
ALT: 19 IU/L (ref 0–32)
AST: 18 IU/L (ref 0–40)
Albumin/Globulin Ratio: 1.6 (ref 1.2–2.2)
Albumin: 4 g/dL (ref 3.8–4.8)
Alkaline Phosphatase: 57 IU/L (ref 44–121)
BUN/Creatinine Ratio: 9 (ref 9–23)
BUN: 5 mg/dL — ABNORMAL LOW (ref 6–20)
Bilirubin Total: 0.3 mg/dL (ref 0.0–1.2)
CO2: 19 mmol/L — ABNORMAL LOW (ref 20–29)
Calcium: 9.6 mg/dL (ref 8.7–10.2)
Chloride: 105 mmol/L (ref 96–106)
Creatinine, Ser: 0.56 mg/dL — ABNORMAL LOW (ref 0.57–1.00)
GFR calc Af Amer: 137 mL/min/{1.73_m2} (ref >59)
GFR calc non Af Amer: 119 mL/min/{1.73_m2} (ref >59)
Globulin, Total: 2.5 g/dL (ref 1.5–4.5)
Glucose: 115 mg/dL — ABNORMAL HIGH (ref 65–99)
Potassium: 3.5 mmol/L (ref 3.5–5.2)
Sodium: 138 mmol/L (ref 134–144)
Total Protein: 6.5 g/dL (ref 6.0–8.5)

## 2020-05-23 LAB — HCV INTERPRETATION

## 2020-05-24 LAB — CYTOLOGY - PAP
Chlamydia: NEGATIVE
Comment: NEGATIVE
Comment: NEGATIVE
Comment: NORMAL
Diagnosis: NEGATIVE
High risk HPV: NEGATIVE
Neisseria Gonorrhea: NEGATIVE

## 2020-05-25 LAB — CULTURE, OB URINE

## 2020-05-25 LAB — URINE CULTURE, OB REFLEX

## 2020-05-29 ENCOUNTER — Encounter: Payer: Self-pay | Admitting: Radiology

## 2020-05-29 ENCOUNTER — Telehealth: Payer: Self-pay | Admitting: Radiology

## 2020-05-29 NOTE — Telephone Encounter (Signed)
Patient was informed of Panorama results including fetal sex 

## 2020-05-30 ENCOUNTER — Telehealth: Payer: Self-pay | Admitting: Radiology

## 2020-05-30 ENCOUNTER — Encounter: Payer: Self-pay | Admitting: Radiology

## 2020-05-30 NOTE — Telephone Encounter (Signed)
Patient was informed of [employer] screening

## 2020-06-19 ENCOUNTER — Ambulatory Visit (INDEPENDENT_AMBULATORY_CARE_PROVIDER_SITE_OTHER): Payer: Medicaid Other | Admitting: Obstetrics & Gynecology

## 2020-06-19 ENCOUNTER — Other Ambulatory Visit: Payer: Self-pay

## 2020-06-19 ENCOUNTER — Encounter: Payer: Self-pay | Admitting: Obstetrics & Gynecology

## 2020-06-19 VITALS — BP 128/77 | HR 106 | Temp 98.1°F | Wt 159.0 lb

## 2020-06-19 DIAGNOSIS — R8290 Unspecified abnormal findings in urine: Secondary | ICD-10-CM

## 2020-06-19 DIAGNOSIS — O0990 Supervision of high risk pregnancy, unspecified, unspecified trimester: Secondary | ICD-10-CM

## 2020-06-19 DIAGNOSIS — G4700 Insomnia, unspecified: Secondary | ICD-10-CM

## 2020-06-19 DIAGNOSIS — O99891 Other specified diseases and conditions complicating pregnancy: Secondary | ICD-10-CM

## 2020-06-19 DIAGNOSIS — Z3A18 18 weeks gestation of pregnancy: Secondary | ICD-10-CM

## 2020-06-19 DIAGNOSIS — R1030 Lower abdominal pain, unspecified: Secondary | ICD-10-CM

## 2020-06-19 DIAGNOSIS — F419 Anxiety disorder, unspecified: Secondary | ICD-10-CM

## 2020-06-19 DIAGNOSIS — O99342 Other mental disorders complicating pregnancy, second trimester: Secondary | ICD-10-CM

## 2020-06-19 LAB — POCT URINALYSIS DIPSTICK: Spec Grav, UA: >=1.03 — AB (ref 1.010–1.025)

## 2020-06-19 MED ORDER — HYDROXYZINE HCL 50 MG PO TABS: 50.0000 mg | ORAL_TABLET | Freq: Three times a day (TID) | ORAL | 2 refills | Status: DC | PRN

## 2020-06-19 MED ORDER — NITROFURANTOIN MONOHYD MACRO 100 MG PO CAPS: 100.0000 mg | ORAL_CAPSULE | Freq: Two times a day (BID) | ORAL | 1 refills | Status: DC

## 2020-06-19 NOTE — Patient Instructions (Signed)

## 2020-06-19 NOTE — Progress Notes (Signed)
PRENATAL VISIT NOTE  Subjective:  Casey Meyer is a 39 y.o. [id number] at [redacted]w[redacted]d being seen today for ongoing prenatal care.  She is currently monitored for the following issues for this high-risk pregnancy and has Supervision of high risk pregnancy, antepartum; Complex cyst of right ovary; AMA (advanced maternal age) multigravida 35+; History of COVID-19; and History of maternal cervical laceration, currently pregnant on their problem list.  Patient reports increased anxiety and insomnia, long history of bipolar disease but no meds. Supposed to be on Zolofy, does not want this. Declines to talk to [facility] clinician. Denies HI/SI. Also reports increased lower abdominal tenderness and increased urinary frequency (7 times/hour).   Contractions: Irritability. Vag. Bleeding: None.  Movement: Present. Denies leaking of fluid.   The following portions of the patient's history were reviewed and updated as appropriate: allergies, current medications, past family history, past medical history, past social history, past surgical history and problem list.   Objective:   Vitals:   06/19/20 1111  BP: 128/77  Pulse: (!) 106  Temp: 98.1 F (36.7 C)  Weight: 159 lb (72.1 kg)    Fetal Status: Fetal Heart Rate (bpm): 142 Fundal Height: 18 cm Movement: Present     General:  Alert, oriented and cooperative. Patient is in no acute distress.  Skin: Skin is warm and dry. No rash noted.   Cardiovascular: Normal heart rate noted  Respiratory: Normal respiratory effort, no problems with respiration noted  Abdomen: Soft, gravid, appropriate for gestational age. +Suprapubic tenderness. Pain/Pressure: Present     Pelvic: Cervical exam deferred        Extremities: Normal range of motion.     Mental Status: Normal mood and affect. Normal behavior. Normal judgment and thought content.   Results for orders placed or performed in visit on 06/19/20 (from the past 24 hour(s))  POCT Urinalysis Dipstick     Status: Abnormal    Collection Time: 06/19/20 11:21 AM  Result Value Ref Range   Color, UA     Clarity, UA     Glucose, UA     Bilirubin, UA     Ketones, UA     Spec Grav, UA >=1.030 (A) 1.010 - 1.025   Blood, UA Moderate    pH, UA     Protein, UA     Urobilinogen, UA     Nitrite, UA     Leukocytes, UA     Appearance     Odor      Assessment and Plan:  Pregnancy: [id number] at [redacted]w[redacted]d 1. Pregnancy with suprapubic cramping, antepartum 2. Abnormal urinalysis - POCT Urinalysis Dipstick and symptoms concerning for possible UTI. Culture sent, Macrobid ordered. Will follow up results and manage accordingly. - Culture, OB Urine - nitrofurantoin, macrocrystal-monohydrate, (MACROBID) 100 MG capsule; Take 1 capsule (100 mg total) by mouth 2 (two) times daily.  Dispense: 14 capsule; Refill: 1   3. Insomnia, unspecified type 4. Anxiety in pregnancy in second trimester, antepartum Vistaril prescribed for now, will continue to evaluate. Told her [facility] clinician is available any time.  - hydrOXYzine (ATARAX/VISTARIL) 50 MG tablet; Take 1 tablet (50 mg total) by mouth 3 (three) times daily as needed for anxiety (insomnia).  Dispense: 30 tablet; Refill: 2  5. [redacted] weeks gestation of pregnancy 6. Supervision of high risk AMA pregnancy, antepartum Low risk NIPS. AFP today. Anatomy scan already scheduled. - AFP, Serum, Open Spina Bifida No other complaints or concerns.  Routine obstetric precautions reviewed.  Please refer to After Visit  Summary for other counseling recommendations.   Return in about 4 weeks (around 07/17/2020) for OFFICE OB VISIT (MD or APP).  Future Appointments  Date Time Provider Department Center  06/26/2020 10:00 AM [facility] NURSE [facility]  06/26/2020 10:15 AM [facility] US2 [facility]  07/17/2020 11:00 AM Westyn Keatley, Jethro Bastos, MD [facility]    Jaynie Collins, MD

## 2020-06-21 LAB — AFP, SERUM, OPEN SPINA BIFIDA
AFP MoM: 1.38
AFP Value: 64 ng/mL
Gest. Age on Collection Date: 18.7 weeks
Maternal Age At EDD: 38.6 yr
OSBR Risk 1 IN: 3810
Test Results:: NEGATIVE
Weight: 159 [lb_av]

## 2020-06-21 LAB — URINE CULTURE, OB REFLEX

## 2020-06-21 LAB — CULTURE, OB URINE

## 2020-06-26 ENCOUNTER — Other Ambulatory Visit: Payer: Self-pay

## 2020-06-26 ENCOUNTER — Encounter: Payer: Self-pay | Admitting: *Deleted

## 2020-06-26 ENCOUNTER — Ambulatory Visit: Payer: Medicaid Other | Attending: Obstetrics and Gynecology

## 2020-06-26 ENCOUNTER — Other Ambulatory Visit: Payer: Self-pay | Admitting: *Deleted

## 2020-06-26 ENCOUNTER — Ambulatory Visit: Payer: Medicaid Other | Admitting: *Deleted

## 2020-06-26 DIAGNOSIS — O26842 Uterine size-date discrepancy, second trimester: Secondary | ICD-10-CM

## 2020-06-26 DIAGNOSIS — O0990 Supervision of high risk pregnancy, unspecified, unspecified trimester: Secondary | ICD-10-CM

## 2020-07-17 ENCOUNTER — Other Ambulatory Visit: Payer: Self-pay

## 2020-07-17 ENCOUNTER — Ambulatory Visit (INDEPENDENT_AMBULATORY_CARE_PROVIDER_SITE_OTHER): Payer: Medicaid Other | Admitting: Obstetrics & Gynecology

## 2020-07-17 ENCOUNTER — Encounter: Payer: Self-pay | Admitting: Obstetrics & Gynecology

## 2020-07-17 VITALS — BP 139/83 | HR 88 | Wt 166.0 lb

## 2020-07-17 DIAGNOSIS — O0990 Supervision of high risk pregnancy, unspecified, unspecified trimester: Secondary | ICD-10-CM

## 2020-07-17 DIAGNOSIS — O09522 Supervision of elderly multigravida, second trimester: Secondary | ICD-10-CM

## 2020-07-17 DIAGNOSIS — Z3A22 22 weeks gestation of pregnancy: Secondary | ICD-10-CM

## 2020-07-17 DIAGNOSIS — O359XX0 Maternal care for (suspected) fetal abnormality and damage, unspecified, not applicable or unspecified: Secondary | ICD-10-CM

## 2020-07-17 NOTE — Patient Instructions (Signed)

## 2020-07-17 NOTE — Progress Notes (Signed)
PRENATAL VISIT NOTE  Subjective:  Casey Meyer is a 39 y.o. [id number] at [redacted]w[redacted]d being seen today for ongoing prenatal care.  She is currently monitored for the following issues for this high-risk pregnancy and has Supervision of high risk pregnancy, antepartum; AMA (advanced maternal age) multigravida 35+; History of COVID-19; History of maternal cervical laceration, currently pregnant; and Fetal abnormality (R CPC, LVEIF) on ultrasound on their problem list.  Patient reports no complaints. Concerned about ultrasound findings (see below).  Contractions: Irritability. Vag. Bleeding: None.  Movement: Present. Denies leaking of fluid.   The following portions of the patient's history were reviewed and updated as appropriate: allergies, current medications, past family history, past medical history, past social history, past surgical history and problem list.   Objective:   Vitals:   07/17/20 1053  BP: 139/83  Pulse: 88  Weight: 166 lb (75.3 kg)    Fetal Status: Fetal Heart Rate (bpm): 145   Movement: Present     General:  Alert, oriented and cooperative. Patient is in no acute distress.  Skin: Skin is warm and dry. No rash noted.   Cardiovascular: Normal heart rate noted  Respiratory: Normal respiratory effort, no problems with respiration noted  Abdomen: Soft, gravid, appropriate for gestational age.  Pain/Pressure: Present     Pelvic: Cervical exam deferred        Extremities: Normal range of motion.     Mental Status: Normal mood and affect. Normal behavior. Normal judgment and thought content.   Imaging: [place] MFM OB DETAIL +14 WK  Result Date: 06/26/2020 ----------------------------------------------------------------------  OBSTETRICS REPORT                       (Signed Final 06/26/2020 11:29 am) ---------------------------------------------------------------------- Patient Info  ID #:       [id number]                          D.O.B.:  1980/11/07 (39 yrs)  Name:       Casey Meyer                    Visit Date: 06/26/2020 10:10 am ---------------------------------------------------------------------- Performed By  Attending:        Ma Rings MD         Ref. Address:     [address]                                                             [address]  Performed By:     Tommie Raymond BS,       [facility]         [facility], [facility]                                [facility]  Referred By:      [facility] ---------------------------------------------------------------------- Orders  #  Description                           Code        Ordered By  1  [place] MFM OB DETAIL +14 WK               [id number]    Alma Bing ----------------------------------------------------------------------  #  Order #                     Accession #                Episode #  1  [id number]                   [id number]                 [id number] ---------------------------------------------------------------------- Indications  Advanced maternal age multigravida 38+,        O37.522  second trimester  Fetal abnormality - other known or             O35.9XX0  suspected (ICEF, CP cyst)  Other mental disorder complicating             O99.340  pregnancy, unspecified trimester  [redacted] weeks gestation of pregnancy                Z3A.19  Ovarian cyst complicating pregnancy            O34.80  Tobacco use complicating pregnancy,            O99.332  second trimester  Encounter for antenatal screening for          Z36.3  malformations ---------------------------------------------------------------------- Fetal Evaluation  Num Of Fetuses:         1  Fetal Heart Rate(bpm):  143  Cardiac Activity:       Observed  Presentation:           Cephalic  Placenta:               Posterior Right Lateral  P. Cord Insertion:      Visualized, central  Amniotic Fluid  AFI FV:       Within normal limits                              Largest Pocket(cm)                              4.8 ---------------------------------------------------------------------- Biometry  BPD:      43.1  mm     G. Age:  19w 0d         52  %    CI:        71.13   %    70 - 86  FL/HC:      17.6   %    16.1 - 18.3  HC:      162.8  mm     G. Age:  19w 0d         45  %    HC/AC:      1.17        1.09 - 1.39  AC:      138.8  mm     G. Age:  19w 2d         56  %    FL/BPD:     66.6   %  FL:       28.7  mm     G. Age:  18w 6d         36  %    FL/AC:      20.7   %    20 - 24  HUM:      27.3  mm     G. Age:  18w 5d         42  %  CER:      20.1  mm     G. Age:  19w 3d         53  %  NFT:       3.2  mm  LV:        7.2  mm  CM:        5.6  mm  Est. FW:     272  gm    0 lb 10 oz      49  % ---------------------------------------------------------------------- OB History  Blood Type:   A+  Gravidity:    5         Term:   2        Prem:   0        SAB:   2  TOP:          0       Ectopic:  0        Living: 2 ---------------------------------------------------------------------- Gestational Age  LMP:           20w 6d        Date:  02/01/20                 EDD:   11/07/20  U/S Today:     19w 0d                                        EDD:   11/20/20  Best:          19w 0d     Det. By:  U/S (06/26/20)           EDD:   11/20/20 ---------------------------------------------------------------------- Anatomy  Cranium:               Appears normal         Aortic Arch:            Appears normal  Cavum:                 Appears normal         Ductal Arch:            Not well visualized  Ventricles:            Appears normal  Diaphragm:              Appears normal  Choroid Plexus:        Right choroid          Stomach:                Appears normal, left                         plexus cyst                                                                        sided  Cerebellum:             Appears normal         Abdomen:                Appears normal  Posterior Fossa:       Appears normal         Abdominal Wall:         Appears nml (cord                                                                        insert, abd wall)  Nuchal Fold:           Appears normal         Cord Vessels:           Appears normal (3                                                                        vessel cord)  Face:                  Appears normal         Kidneys:                Appear normal                         (orbits and profile)  Lips:                  Appears normal         Bladder:                Appears normal  Thoracic:              Appears normal         Spine:                  Limited views  appear normal  Heart:                 Appears normal; EIF    Upper Extremities:      Appears normal  RVOT:                  Not well visualized    Lower Extremities:      Appears normal  LVOT:                  Not well visualized  Other:  Fetus appears to be female. Heels/feet and open hands/5th digits          visualized. Nasal bone visualized. ---------------------------------------------------------------------- Cervix Uterus Adnexa  Cervix  Length:           3.74  cm.  Normal appearance by transabdominal scan.  Uterus  No abnormality visualized.  Right Ovary  Within normal limits. No adnexal mass visualized.  Left Ovary  Within normal limits. No adnexal mass visualized.  Cul De Sac  No free fluid seen.  Adnexa  No abnormality visualized. ---------------------------------------------------------------------- Comments  This patient was seen for a detailed fetal anatomy scan due  to due to advanced maternal age.  She denies any significant past medical history and denies  any problems in her current pregnancy.  She had a cell free DNA test earlier in her pregnancy which  indicated a low risk for trisomy 83, 73, and 13. A female fetus  is  predicted.  The fetal biometry measurements obtained today, her EDC  was changed to Nov 20, 2020, making her 19 weeks and 0  days today.  On today's exam, an intracardiac echogenic focus was noted  in the left ventricle of the fetal heart.  The small association  between an echogenic focus and Down syndrome was  discussed.  A right-sided choroid plexus cysts was also noted in the fetal  brain.  The implications and management of choroid plexus  cysts were discussed today.  She was advised that choroid  plexus cysts are most likely normal variants and will usually  resolve at around 24 weeks.  The small association of  bilateral choroid plexus cysts with trisomy 18 was discussed.  She was advised that as no other anomalies were noted on  today's ultrasound exam, it is highly unlikely that her fetus  has trisomy 50.  Due to the small association between choroid plexus cysts  and trisomy 18 and echogenic focus with Down syndrome,  she was offered and declined an amniocentesis today for  definitive diagnosis of fetal aneuploidy.  The patient was informed that anomalies may be missed due  to technical limitations. If the fetus is in a suboptimal position  or maternal habitus is increased, visualization of the fetus in  the maternal uterus may be impaired.  A follow-up exam was scheduled in 4 weeks for follow-up of  the right sided CP cyst noted today and to complete the fetal  cardiac views which were limited today due to the fetal  position. ----------------------------------------------------------------------                   Ma Rings, MD Electronically Signed Final Report   06/26/2020 11:29 am ----------------------------------------------------------------------   Assessment and Plan:  Pregnancy: [id number] at [redacted]w[redacted]d 1. Fetal abnormality (R CPC, LVEIF) on ultrasound 2. Multigravida of advanced maternal age in second trimester Reassured by low risk NIPS, patient reassured. She declined amniocentesis. Next scan  is on 07/24/2020, will follow up  results and manage accordingly.  3. [redacted] weeks gestation of pregnancy 4. Supervision of high risk pregnancy, antepartum No other complaints. Preterm labor symptoms and general obstetric precautions including but not limited to vaginal bleeding, contractions, leaking of fluid and fetal movement were reviewed in detail with the patient. Please refer to After Visit Summary for other counseling recommendations.   Return in about 3 weeks (around 08/07/2020) for OFFICE OB VISIT (MD or APP)  6 weeks from now: 2 hr GTT, 3rd trimester labs, TDap, OFFICE OB VISIT.  Future Appointments  Date Time Provider Department Center  07/24/2020  1:15 PM [facility] NURSE [facility]  07/24/2020  1:30 PM [facility] US3 [facility]    Jaynie Collins, MD

## 2020-07-23 IMAGING — CT CT ABDOMEN AND PELVIS WITH CONTRAST
2 of 4 series · 16 of 46 positions shown, 18 images · IV contrast (APPLIED)
Comparison: 11/25/2015

CLINICAL DATA: Abdominal pain

EXAM:
CT ABDOMEN AND PELVIS WITH CONTRAST
TECHNIQUE: Multidetector CT imaging of the abdomen and pelvis was performed
using the standard protocol following bolus administration of
intravenous contrast.
CONTRAST:  100mL OMNIPAQUE IOHEXOL 300 MG/ML  SOLN

[Series 2: routine abd/pel with · axial · 0.72mm/px · z∈[-888,-513]mm · 13 of 83 slices shown, 15 images]
[im 4/83  soft-tissue]
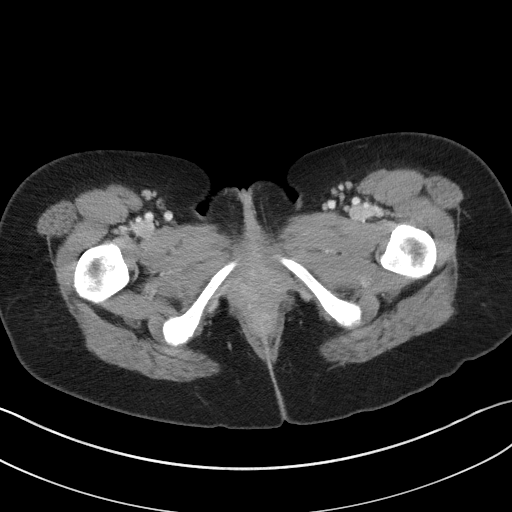
[im 4/83  bone]
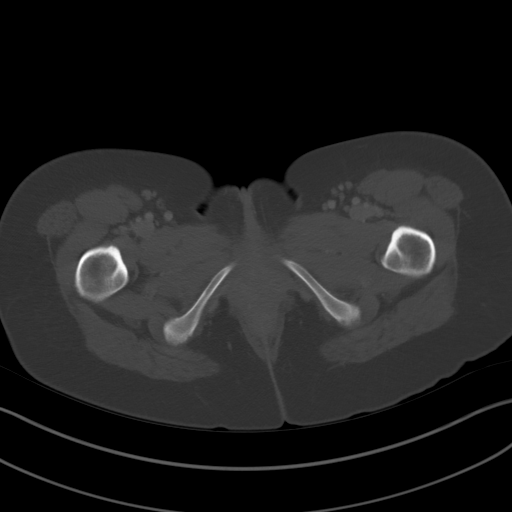
[im 11/83  soft-tissue]
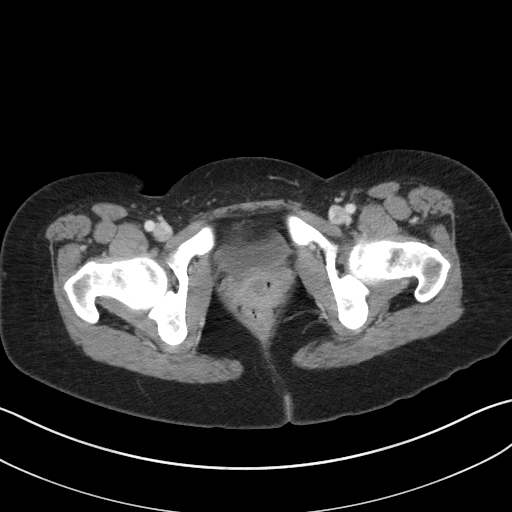
[im 18/83  soft-tissue]
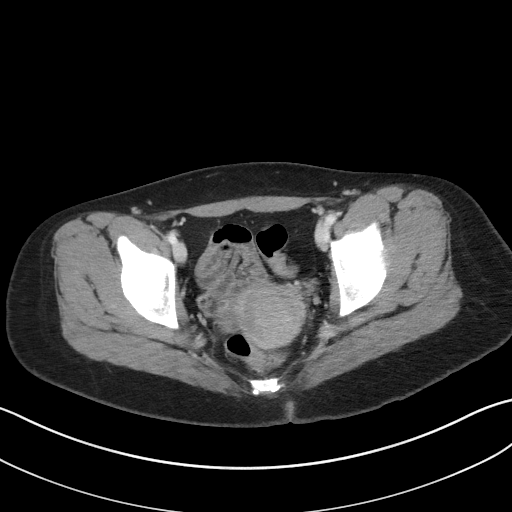
[im 24/83  soft-tissue]
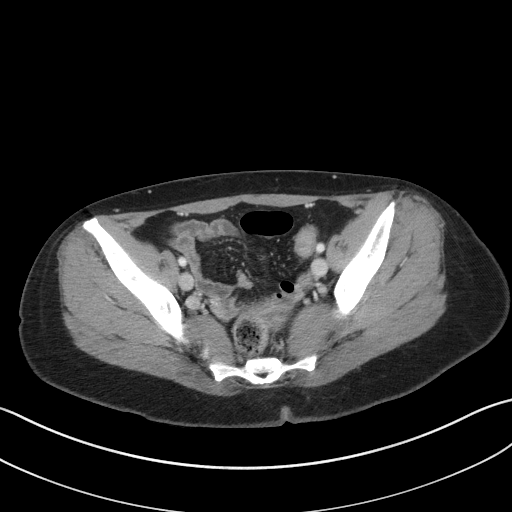
[im 28/83  soft-tissue]
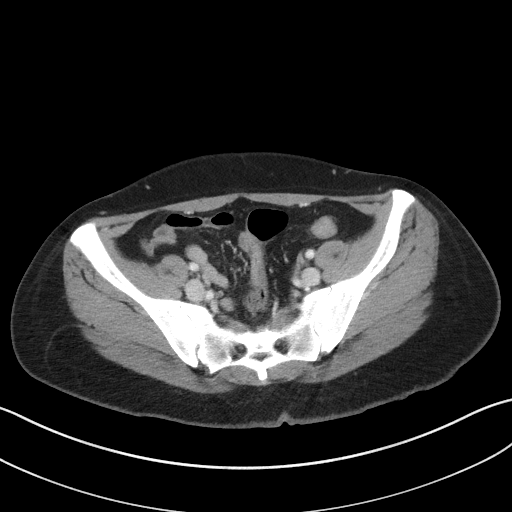
[im 35/83  soft-tissue]
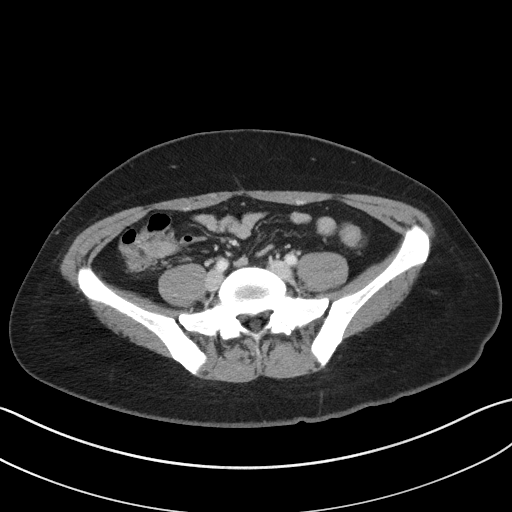
[im 42/83  soft-tissue]
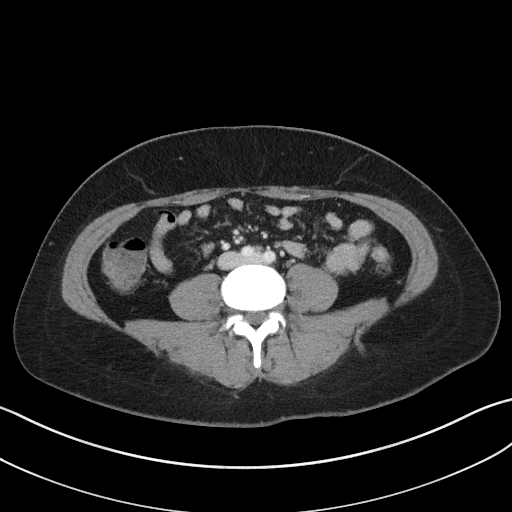
[im 48/83  soft-tissue]
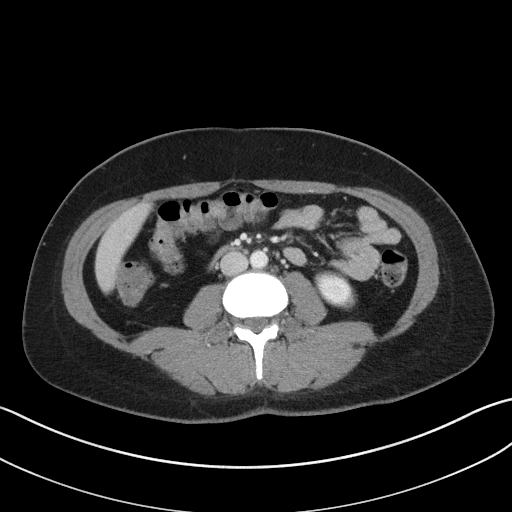
[im 55/83  soft-tissue]
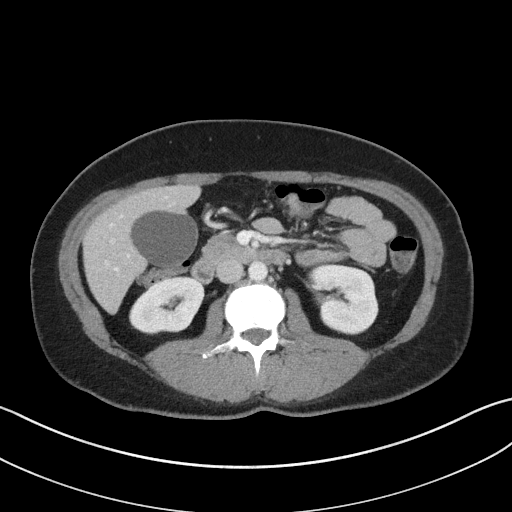
[im 55/83  bone]
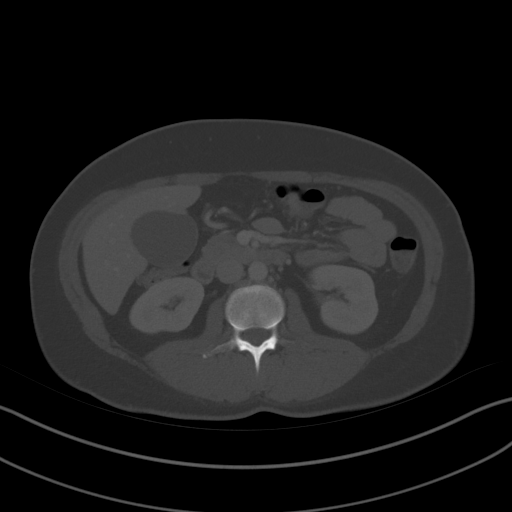
[im 59/83  soft-tissue]
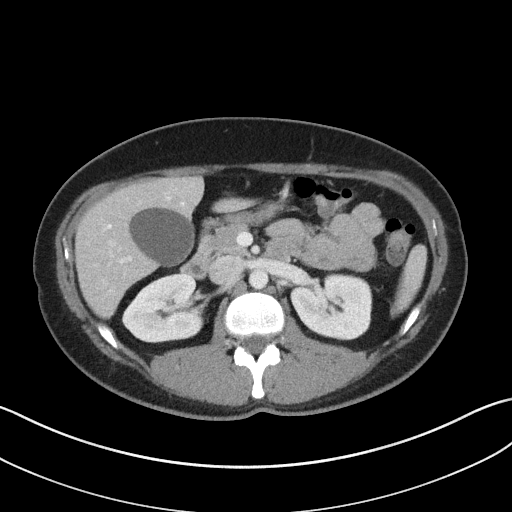
[im 65/83  soft-tissue]
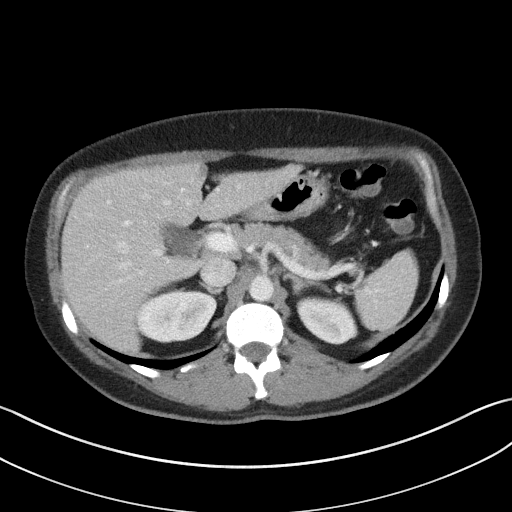
[im 72/83  soft-tissue]
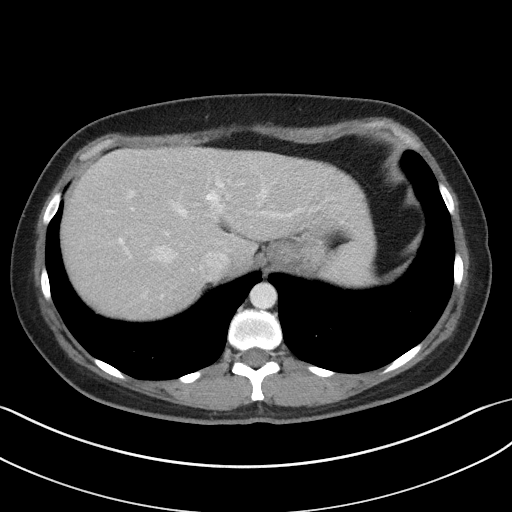
[im 79/83  soft-tissue]
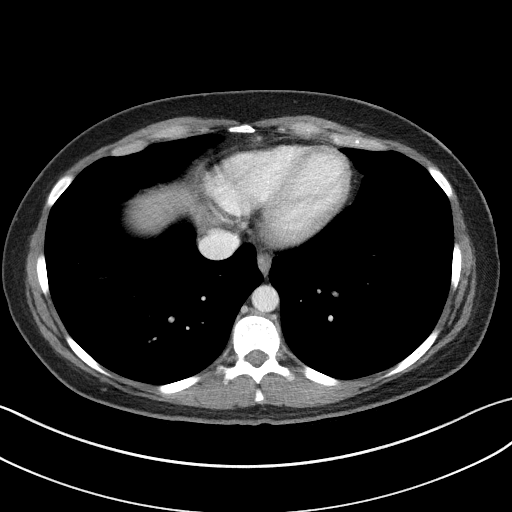

[Series 5: coronal st · coronal · 0.69mm/px · 3 of 68 slices shown]
[im 23/68  soft-tissue]
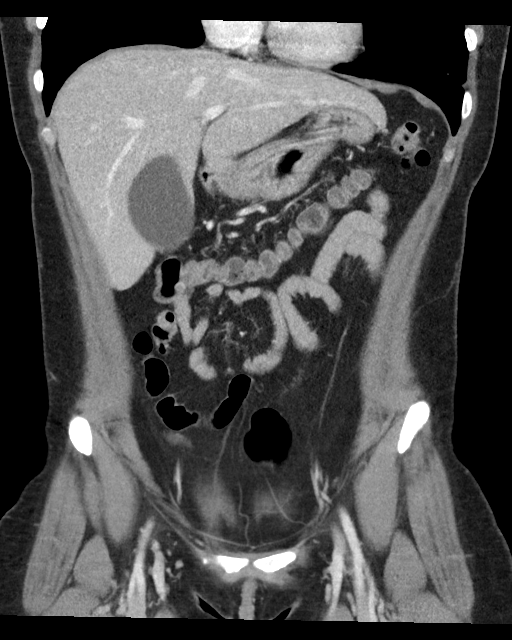
[im 30/68  soft-tissue]
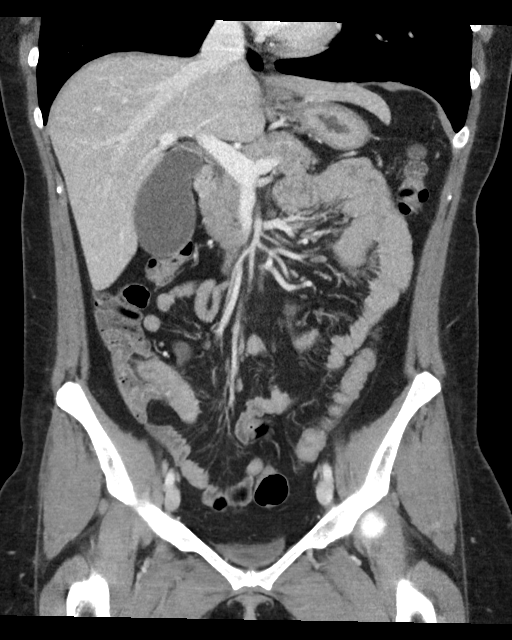
[im 38/68  soft-tissue]
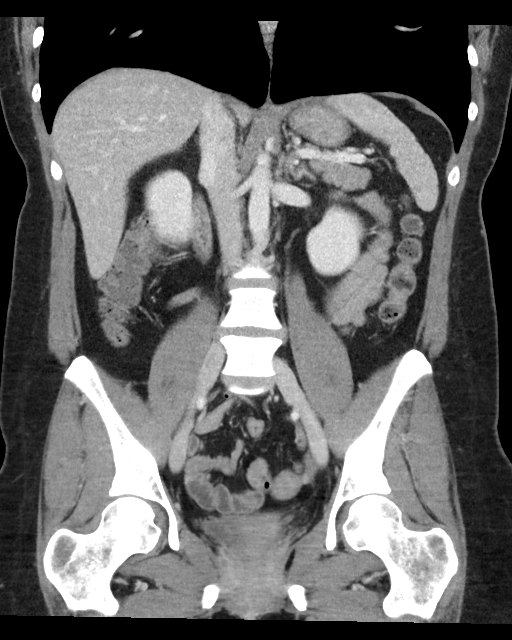

[16 of 46 positions shown; findings below may reference images not displayed]

FINDINGS: LOWER CHEST: There is no basilar pleural or apical pericardial
effusion.

HEPATOBILIARY: The hepatic contours and density are normal. There is
no intra- or extrahepatic biliary dilatation. The gallbladder is
normal.

PANCREAS: The pancreatic parenchymal contours are normal and there
is no ductal dilatation. There is no peripancreatic fluid
collection.

SPLEEN: Normal.

ADRENALS/URINARY TRACT:

--Adrenal glands: Normal.

--Right kidney/ureter: No hydronephrosis, nephroureterolithiasis,
perinephric stranding or solid renal mass.

--Left kidney/ureter: No hydronephrosis, nephroureterolithiasis,
perinephric stranding or solid renal mass.

--Urinary bladder: Normal for degree of distention

STOMACH/BOWEL:

--Stomach/Duodenum: There is no hiatal hernia or other gastric
abnormality. The duodenal course and caliber are normal.

--Small bowel: No dilatation or inflammation.

--Colon: No focal abnormality.

--Appendix: Normal.

VASCULAR/LYMPHATIC: Normal course and caliber of the major abdominal
vessels. No abdominal or pelvic lymphadenopathy.

REPRODUCTIVE: Normal uterus and ovaries.

MUSCULOSKELETAL. No bony spinal canal stenosis or focal osseous
abnormality.

OTHER: None.
IMPRESSION: No acute abdominal or pelvic abnormality.

## 2020-07-24 ENCOUNTER — Other Ambulatory Visit: Payer: Self-pay

## 2020-07-24 ENCOUNTER — Ambulatory Visit: Payer: Medicaid Other | Attending: Obstetrics

## 2020-07-24 ENCOUNTER — Encounter: Payer: Self-pay | Admitting: *Deleted

## 2020-07-24 ENCOUNTER — Other Ambulatory Visit: Payer: Self-pay | Admitting: *Deleted

## 2020-07-24 ENCOUNTER — Ambulatory Visit: Payer: Medicaid Other | Admitting: *Deleted

## 2020-07-24 DIAGNOSIS — Z3A23 23 weeks gestation of pregnancy: Secondary | ICD-10-CM

## 2020-07-24 DIAGNOSIS — O0990 Supervision of high risk pregnancy, unspecified, unspecified trimester: Secondary | ICD-10-CM | POA: Insufficient documentation

## 2020-07-24 DIAGNOSIS — Z362 Encounter for other antenatal screening follow-up: Secondary | ICD-10-CM

## 2020-07-24 DIAGNOSIS — O09522 Supervision of elderly multigravida, second trimester: Secondary | ICD-10-CM

## 2020-07-24 DIAGNOSIS — O26842 Uterine size-date discrepancy, second trimester: Secondary | ICD-10-CM | POA: Diagnosis not present

## 2020-07-24 DIAGNOSIS — O358XX0 Maternal care for other (suspected) fetal abnormality and damage, not applicable or unspecified: Secondary | ICD-10-CM

## 2020-07-24 DIAGNOSIS — F17200 Nicotine dependence, unspecified, uncomplicated: Secondary | ICD-10-CM

## 2020-07-24 DIAGNOSIS — O99332 Smoking (tobacco) complicating pregnancy, second trimester: Secondary | ICD-10-CM

## 2020-07-24 DIAGNOSIS — F99 Mental disorder, not otherwise specified: Secondary | ICD-10-CM

## 2020-07-24 DIAGNOSIS — O99342 Other mental disorders complicating pregnancy, second trimester: Secondary | ICD-10-CM

## 2020-08-07 ENCOUNTER — Encounter: Payer: Medicaid Other | Admitting: Obstetrics & Gynecology

## 2020-08-14 ENCOUNTER — Other Ambulatory Visit: Payer: Self-pay

## 2020-08-14 ENCOUNTER — Telehealth (INDEPENDENT_AMBULATORY_CARE_PROVIDER_SITE_OTHER): Payer: Medicaid Other | Admitting: Obstetrics and Gynecology

## 2020-08-14 DIAGNOSIS — Z8616 Personal history of COVID-19: Secondary | ICD-10-CM

## 2020-08-14 DIAGNOSIS — O0992 Supervision of high risk pregnancy, unspecified, second trimester: Secondary | ICD-10-CM | POA: Diagnosis not present

## 2020-08-14 DIAGNOSIS — O0990 Supervision of high risk pregnancy, unspecified, unspecified trimester: Secondary | ICD-10-CM

## 2020-08-14 DIAGNOSIS — O359XX0 Maternal care for (suspected) fetal abnormality and damage, unspecified, not applicable or unspecified: Secondary | ICD-10-CM | POA: Diagnosis not present

## 2020-08-14 DIAGNOSIS — O09522 Supervision of elderly multigravida, second trimester: Secondary | ICD-10-CM

## 2020-08-14 DIAGNOSIS — Z3A26 26 weeks gestation of pregnancy: Secondary | ICD-10-CM

## 2020-08-14 DIAGNOSIS — Z8759 Personal history of other complications of pregnancy, childbirth and the puerperium: Secondary | ICD-10-CM

## 2020-08-14 NOTE — Progress Notes (Signed)
   TELEHEALTH VIRTUAL OBSTETRICS VISIT ENCOUNTER NOTE  Clinic: Center for [facility]  I connected with Casey Meyer on 08/14/20 at  9:00 AM EST by telephone at home and verified that I am speaking with the correct person using two identifiers.   I discussed the limitations, risks, security and privacy concerns of performing an evaluation and management service by telephone and the availability of in person appointments. I also discussed with the patient that there may be a patient responsible charge related to this service. The patient expressed understanding and agreed to proceed.  Subjective:  Casey Meyer is a 40 y.o. [id number] at [redacted]w[redacted]d being followed for ongoing prenatal care.  She is currently monitored for the following issues for this high-risk pregnancy and has Supervision of high risk pregnancy, antepartum; AMA (advanced maternal age) multigravida 35+; History of COVID-19; History of maternal cervical laceration, currently pregnant; and Fetal abnormality (R CPC, LVEIF) on ultrasound on their problem list.  Patient reports carpal tunnel symptoms and spotting on sunday. Reports fetal movement. Denies any contractions, bleeding or leaking of fluid.   The following portions of the patient's history were reviewed and updated as appropriate: allergies, current medications, past family history, past medical history, past social history, past surgical history and problem list.   Objective:  There were no vitals filed for this visit.  Babyscripts Data Reviewed: not applicable  General:  Alert, oriented and cooperative.   Mental Status: Normal mood and affect perceived. Normal judgment and thought content.  Rest of physical exam deferred due to type of encounter  Assessment and Plan:  Pregnancy: [id number] at [redacted]w[redacted]d 1. Supervision of high risk pregnancy, antepartum Routine care. Behavioral interventions to help with CTS s/s d/w her and compression stockings  2h GTT  nv  2. Multigravida of advanced maternal age in second trimester No current issues  3. Fetal abnormality during pregnancy, antepartum, single or unspecified fetus No current issues. Has rpt mfm u/s in march  4. [redacted] weeks gestation of pregnancy  Preterm labor symptoms and general obstetric precautions including but not limited to vaginal bleeding, contractions, leaking of fluid and fetal movement were reviewed in detail with the patient.  I discussed the assessment and treatment plan with the patient. The patient was provided an opportunity to ask questions and all were answered. The patient agreed with the plan and demonstrated an understanding of the instructions. The patient was advised to call back or seek an in-person office evaluation/go to MAU at [facility] for any urgent or concerning symptoms. Please refer to After Visit Summary for other counseling recommendations.   I provided 7 minutes of non-face-to-face time during this encounter. The visit was conducted via MyChart-medicine  No follow-ups on file.  Future Appointments  Date Time Provider Department Center  08/28/2020  8:30 AM Anyanwu, Jethro Bastos, MD [facility]  10/02/2020 10:30 AM [facility] NURSE [facility]  10/02/2020 10:45 AM [facility] US4 [facility]    [place] Bing, MD Center for [employer], [facility]

## 2020-08-14 NOTE — Progress Notes (Signed)
Had an episode of spotting last Sunday nothing since then   Hands and feet are swollen and hand get tingly     I connected with  [name] on 08/14/20 at  9:00 AM EST by telephone and verified that I am speaking with the correct person using two identifiers.   I discussed the limitations, risks, security and privacy concerns of performing an evaluation and management service by telephone and the availability of in person appointments. I also discussed with the patient that there may be a patient responsible charge related to this service. The patient expressed understanding and agreed to proceed.  Scheryl Marten, RN 08/14/2020  8:58 AM

## 2020-08-28 ENCOUNTER — Telehealth (INDEPENDENT_AMBULATORY_CARE_PROVIDER_SITE_OTHER): Payer: Medicaid Other | Admitting: Obstetrics & Gynecology

## 2020-08-28 ENCOUNTER — Other Ambulatory Visit: Payer: Self-pay

## 2020-08-28 DIAGNOSIS — R21 Rash and other nonspecific skin eruption: Secondary | ICD-10-CM

## 2020-08-28 DIAGNOSIS — Z8616 Personal history of COVID-19: Secondary | ICD-10-CM

## 2020-08-28 DIAGNOSIS — O26893 Other specified pregnancy related conditions, third trimester: Secondary | ICD-10-CM

## 2020-08-28 DIAGNOSIS — Z8759 Personal history of other complications of pregnancy, childbirth and the puerperium: Secondary | ICD-10-CM

## 2020-08-28 DIAGNOSIS — Z3A28 28 weeks gestation of pregnancy: Secondary | ICD-10-CM

## 2020-08-28 DIAGNOSIS — G5600 Carpal tunnel syndrome, unspecified upper limb: Secondary | ICD-10-CM

## 2020-08-28 DIAGNOSIS — O99891 Other specified diseases and conditions complicating pregnancy: Secondary | ICD-10-CM

## 2020-08-28 DIAGNOSIS — O0990 Supervision of high risk pregnancy, unspecified, unspecified trimester: Secondary | ICD-10-CM

## 2020-08-28 DIAGNOSIS — O09523 Supervision of elderly multigravida, third trimester: Secondary | ICD-10-CM

## 2020-08-28 DIAGNOSIS — O0993 Supervision of high risk pregnancy, unspecified, third trimester: Secondary | ICD-10-CM

## 2020-08-28 DIAGNOSIS — O26899 Other specified pregnancy related conditions, unspecified trimester: Secondary | ICD-10-CM

## 2020-08-28 NOTE — Progress Notes (Signed)
   OBSTETRICS PRENATAL VIRTUAL VISIT ENCOUNTER NOTE  Provider location: Center for [facility]   I connected with Casey Meyer on 08/28/20 at  8:30 AM EST by MyChart Video Encounter at home and verified that I am speaking with the correct person using two identifiers.   I discussed the limitations, risks, security and privacy concerns of performing an evaluation and management service virtually and the availability of in person appointments. I also discussed with the patient that there may be a patient responsible charge related to this service. The patient expressed understanding and agreed to proceed. Subjective:  Casey Meyer is a 40 y.o. [id number] at [redacted]w[redacted]d being seen today for ongoing prenatal care.  She is currently monitored for the following issues for this high-risk pregnancy and has Supervision of high risk pregnancy, antepartum; AMA (advanced maternal age) multigravida 35+; History of COVID-19; History of maternal cervical laceration, currently pregnant; and Fetal abnormality (R CPC, LVEIF) on ultrasound on their problem list.  Patient reports carpal tunnel symptoms, will get a wrist stabilizer soon. Also has a facial rash, ameliorated with HC cream.  Contractions: Irritability. Vag. Bleeding: None.  Movement: Present. Denies any leaking of fluid.   The following portions of the patient's history were reviewed and updated as appropriate: allergies, current medications, past family history, past medical history, past social history, past surgical history and problem list.   Objective:   Fetal Status:     Movement: Present     General:  Alert, oriented and cooperative. Patient is in no acute distress.  Respiratory: Normal respiratory effort, no problems with respiration noted  Mental Status: Normal mood and affect. Normal behavior. Normal judgment and thought content.  Rest of physical exam deferred due to type of encounter   Assessment and Plan:  Pregnancy:  [id number] at [redacted]w[redacted]d 1. Facial rash Rash resolved on video encounter, continue HC cream as needed.  2. Carpal tunnel syndrome during pregnancy Will get wrist brace to help. Reassured that this occurs frequently in pregnancy.  3. [redacted] weeks gestation of pregnancy 4. Multigravida of advanced maternal age in third trimester 5. Supervision of high risk pregnancy, antepartum Scheduled for MFM scan on 10/02/20. Third trimester labs later this week, will follow up results and manage accordingly.  Preterm labor symptoms and general obstetric precautions including but not limited to vaginal bleeding, contractions, leaking of fluid and fetal movement were reviewed in detail with the patient. I discussed the assessment and treatment plan with the patient. The patient was provided an opportunity to ask questions and all were answered. The patient agreed with the plan and demonstrated an understanding of the instructions. The patient was advised to call back or seek an in-person office evaluation/go to MAU at [facility] for any urgent or concerning symptoms. Please refer to After Visit Summary for other counseling recommendations.   I provided 6 minutes of face-to-face time during this encounter.  Return in about 3 days (around 08/31/2020) for 2 hr GTT, 3rd trimester labs, TDap  2 weeks: OFFICE OB VISIT (MD only).  Future Appointments  Date Time Provider Department Center  08/31/2020  8:30 AM [facility] LAB [facility]  09/11/2020 10:15 AM [place] Bing, MD [facility]  10/02/2020 10:30 AM [facility] NURSE [facility]  10/02/2020 10:45 AM [facility] US4 [facility]    Jaynie Collins, MD Center for [employer], [facility]

## 2020-08-28 NOTE — Patient Instructions (Addendum)
Carpal Tunnel Syndrome  Carpal tunnel syndrome is a condition that causes pain, numbness, and weakness in your hand and fingers. The carpal tunnel is a narrow area located on the palm side of your wrist. Repeated wrist motion or certain diseases may cause swelling within the tunnel. This swelling pinches the main nerve in the wrist. The main nerve in the wrist is called the median nerve. What are the causes? This condition may be caused by:  Repeated and forceful wrist and hand motions.  Wrist injuries.  Arthritis.  A cyst or tumor in the carpal tunnel.  Fluid buildup during pregnancy.  Use of tools that vibrate. Sometimes the cause of this condition is not known. What increases the risk? The following factors may make you more likely to develop this condition:  Having a job that requires you to repeatedly or forcefully move your wrist or hand or requires you to use tools that vibrate. This may include jobs that involve using computers, working on an [employer], or working with power tools such as Radiographer, therapeutic.  Being a woman.  Having certain conditions, such as: ? Pregnancy. ? Diabetes. ? Obesity. ? An underactive thyroid (hypothyroidism). ? Kidney failure. ? Rheumatoid arthritis. What are the signs or symptoms? Symptoms of this condition include:  A tingling feeling in your fingers, especially in your thumb, index, and middle fingers.  Tingling or numbness in your hand.  An aching feeling in your entire arm, especially when your wrist and elbow are bent for a long time.  Wrist pain that goes up your arm to your shoulder.  Pain that goes down into your palm or fingers.  A weak feeling in your hands. You may have trouble grabbing and holding items. Your symptoms may feel worse during the night. How is this diagnosed? This condition is diagnosed with a medical history and physical exam. You may also have tests, including:  Electromyogram (EMG). This test  measures electrical signals sent by your nerves into the muscles.  Nerve conduction study. This test measures how well electrical signals pass through your nerves.  Imaging tests, such as X-rays, ultrasound, and MRI. These tests check for possible causes of your condition. How is this treated? This condition may be treated with:  Lifestyle changes. It is important to stop or change the activity that caused your condition.  Doing exercise and activities to strengthen and stretch your muscles and tendons (physical therapy).  Making lifestyle changes to help with your condition and learning how to do your daily activities safely (occupational therapy).  Medicines for pain and inflammation. This may include medicine that is injected into your wrist.  A wrist splint or brace.  Surgery. Follow these instructions at home: If you have a splint or brace:  Wear the splint or brace as told by your health care provider. Remove it only as told by your health care provider.  Loosen the splint or brace if your fingers tingle, become numb, or turn cold and blue.  Keep the splint or brace clean.  If the splint or brace is not waterproof: ? Do not let it get wet. ? Cover it with a watertight covering when you take a bath or shower. Managing pain, stiffness, and swelling If directed, put ice on the painful area. To do this:  If you have a removeable splint or brace, remove it as told by your health care provider.  Put ice in a plastic bag.  Place a towel between your skin and  the bag or between the splint or brace and the bag.  Leave the ice on for 20 minutes, 2-3 times a day. Do not fall asleep with the cold pack on your skin.  Remove the ice if your skin turns bright red. This is very important. If you cannot feel pain, heat, or cold, you have a greater risk of damage to the area. Move your fingers often to reduce stiffness and swelling.   General instructions  Take over-the-counter and  prescription medicines only as told by your health care provider.  Rest your wrist and hand from any activity that may be causing your pain. If your condition is work related, talk with your employer about changes that can be made, such as getting a wrist pad to use while typing.  Do any exercises as told by your health care provider, physical therapist, or occupational therapist.  Keep all follow-up visits. This is important. Contact a health care provider if:  You have new symptoms.  Your pain is not controlled with medicines.  Your symptoms get worse. Get help right away if:  You have severe numbness or tingling in your wrist or hand. Summary  Carpal tunnel syndrome is a condition that causes pain, numbness, and weakness in your hand and fingers.  It is usually caused by repeated wrist motions.  Lifestyle changes and medicines are used to treat carpal tunnel syndrome. Surgery may be recommended.  Follow your health care provider's instructions about wearing a splint, resting from activity, keeping follow-up visits, and calling for help. This information is not intended to replace advice given to you by your health care provider. Make sure you discuss any questions you have with your health care provider. Document Revised: 11/17/2019 Document Reviewed: 11/17/2019 Elsevier Patient Education  2021 [employer].   Third Trimester of Pregnancy  The third trimester of pregnancy is from week 28 through week 40. This is months 7 through 9. The third trimester is a time when the unborn baby (fetus) is growing rapidly. At the end of the ninth month, the fetus is about 20 inches long and weighs 6-10 pounds. Body changes during your third trimester During the third trimester, your body will continue to go through many changes. The changes vary and generally return to normal after your baby is born. Physical changes  Your weight will continue to increase. You can expect to gain 25-35  pounds (11-16 kg) by the end of the pregnancy if you begin pregnancy at a normal weight. If you are underweight, you can expect to gain 28-40 lb (about 13-18 kg), and if you are overweight, you can expect to gain 15-25 lb (about 7-11 kg).  You may begin to get stretch marks on your hips, abdomen, and breasts.  Your breasts will continue to grow and may hurt. A yellow fluid (colostrum) may leak from your breasts. This is the first milk you are producing for your baby.  You may have changes in your hair. These can include thickening of your hair, rapid growth, and changes in texture. Some people also have hair loss during or after pregnancy, or hair that feels dry or thin.  Your belly button may stick out.  You may notice more swelling in your hands, face, or ankles. Health changes  You may have heartburn.  You may have constipation.  You may develop hemorrhoids.  You may develop swollen, bulging veins (varicose veins) in your legs.  You may have increased body aches in the pelvis, back, or  thighs. This is due to weight gain and increased hormones that are relaxing your joints.  You may have increased tingling or numbness in your hands, arms, and legs. The skin on your abdomen may also feel numb.  You may feel short of breath because of your expanding uterus. Other changes  You may urinate more often because the fetus is moving lower into your pelvis and pressing on your bladder.  You may have more problems sleeping. This may be caused by the size of your abdomen, an increased need to urinate, and an increase in your body's metabolism.  You may notice the fetus "dropping," or moving lower in your abdomen (lightening).  You may have increased vaginal discharge.  You may notice that you have pain around your pelvic bone as your uterus distends. Follow these instructions at home: Medicines  Follow your health care provider's instructions regarding medicine use. Specific medicines  may be either safe or unsafe to take during pregnancy. Do not take any medicines unless approved by your health care provider.  Take a prenatal vitamin that contains at least 600 micrograms (mcg) of folic acid. Eating and drinking  Eat a healthy diet that includes fresh fruits and vegetables, whole grains, good sources of protein such as meat, eggs, or tofu, and low-fat dairy products.  Avoid raw meat and unpasteurized juice, milk, and cheese. These carry germs that can harm you and your baby.  Eat 4 or 5 small meals rather than 3 large meals a day.  You may need to take these actions to prevent or treat constipation: ? Drink enough fluid to keep your urine pale yellow. ? Eat foods that are high in fiber, such as beans, whole grains, and fresh fruits and vegetables. ? Limit foods that are high in fat and processed sugars, such as fried or sweet foods. Activity  Exercise only as directed by your health care provider. Most people can continue their usual exercise routine during pregnancy. Try to exercise for 30 minutes at least 5 days a week. Stop exercising if you experience contractions in the uterus.  Stop exercising if you develop pain or cramping in the lower abdomen or lower back.  Avoid heavy lifting.  Do not exercise if it is very hot or humid or if you are at a high altitude.  If you choose to, you may continue to have sex unless your health care provider tells you not to. Relieving pain and discomfort  Take frequent breaks and rest with your legs raised (elevated) if you have leg cramps or low back pain.  Take warm sitz baths to soothe any pain or discomfort caused by hemorrhoids. Use hemorrhoid cream if your health care provider approves.  Wear a supportive bra to prevent discomfort from breast tenderness.  If you develop varicose veins: ? Wear support hose as told by your health care provider. ? Elevate your feet for 15 minutes, 3-4 times a day. ? Limit salt in your  diet. Safety  Talk to your health care provider before traveling far distances.  Do not use hot tubs, steam rooms, or saunas.  Wear your seat belt at all times when driving or riding in a car.  Talk with your health care provider if someone is verbally or physically abusive to you. Preparing for birth To prepare for the arrival of your baby:  Take prenatal classes to understand, practice, and ask questions about labor and delivery.  Visit the hospital and tour the maternity area.  Purchase a  rear-facing car seat and make sure you know how to install it in your car.  Prepare the baby's room or sleeping area. Make sure to remove all pillows and stuffed animals from the baby's crib to prevent suffocation. General instructions  Avoid cat litter boxes and soil used by cats. These carry germs that can cause birth defects in the baby. If you have a cat, ask someone to clean the litter box for you.  Do not douche or use tampons. Do not use scented sanitary pads.  Do not use any products that contain nicotine or tobacco, such as cigarettes, e-cigarettes, and chewing tobacco. If you need help quitting, ask your health care provider.  Do not use any herbal remedies, illegal drugs, or medicines that were not prescribed to you. Chemicals in these products can harm your baby.  Do not drink alcohol.  You will have more frequent prenatal exams during the third trimester. During a routine prenatal visit, your health care provider will do a physical exam, perform tests, and discuss your overall health. Keep all follow-up visits. This is important. Where to find more information  American Pregnancy Association: americanpregnancy.org  [employer] of Obstetricians and Gynecologists: https://www.todd-brady.net/  Office on [employer] Health: MightyReward.co.nz Contact a health care provider if you have:  A fever.  Mild pelvic cramps, pelvic pressure, or nagging pain in your  abdominal area or lower back.  Vomiting or diarrhea.  Bad-smelling vaginal discharge or foul-smelling urine.  Pain when you urinate.  A headache that does not go away when you take medicine.  Visual changes or see spots in front of your eyes. Get help right away if:  Your water breaks.  You have regular contractions less than 5 minutes apart.  You have spotting or bleeding from your vagina.  You have severe abdominal pain.  You have difficulty breathing.  You have chest pain.  You have fainting spells.  You have not felt your baby move for the time period told by your health care provider.  You have new or increased pain, swelling, or redness in an arm or leg. Summary  The third trimester of pregnancy is from week 28 through week 40 (months 7 through 9).  You may have more problems sleeping. This can be caused by the size of your abdomen, an increased need to urinate, and an increase in your body's metabolism.  You will have more frequent prenatal exams during the third trimester. Keep all follow-up visits. This is important. This information is not intended to replace advice given to you by your health care provider. Make sure you discuss any questions you have with your health care provider. Document Revised: 12/14/2019 Document Reviewed: 10/20/2019 Elsevier Patient Education  2021 [employer].

## 2020-08-28 NOTE — Progress Notes (Signed)
I connected with  Casey Meyer on 08/28/20 at  8:30 AM EST by telephone and verified that I am speaking with the correct person using two identifiers.   I discussed the limitations, risks, security and privacy concerns of performing an evaluation and management service by telephone and the availability of in person appointments. I also discussed with the patient that there may be a patient responsible charge related to this service. The patient expressed understanding and agreed to proceed.  Scheryl Marten, RN 08/28/2020  9:51 AM

## 2020-08-31 ENCOUNTER — Other Ambulatory Visit: Payer: Medicaid Other

## 2020-09-03 ENCOUNTER — Other Ambulatory Visit: Payer: Medicaid Other

## 2020-09-11 ENCOUNTER — Ambulatory Visit (INDEPENDENT_AMBULATORY_CARE_PROVIDER_SITE_OTHER): Payer: Medicaid Other | Admitting: Obstetrics and Gynecology

## 2020-09-11 ENCOUNTER — Other Ambulatory Visit: Payer: Self-pay

## 2020-09-11 VITALS — BP 126/78 | HR 94 | Wt 182.2 lb

## 2020-09-11 DIAGNOSIS — Z3A30 30 weeks gestation of pregnancy: Secondary | ICD-10-CM

## 2020-09-11 DIAGNOSIS — O09523 Supervision of elderly multigravida, third trimester: Secondary | ICD-10-CM

## 2020-09-11 DIAGNOSIS — O0990 Supervision of high risk pregnancy, unspecified, unspecified trimester: Secondary | ICD-10-CM

## 2020-09-11 DIAGNOSIS — O359XX0 Maternal care for (suspected) fetal abnormality and damage, unspecified, not applicable or unspecified: Secondary | ICD-10-CM

## 2020-09-11 DIAGNOSIS — O24419 Gestational diabetes mellitus in pregnancy, unspecified control: Secondary | ICD-10-CM

## 2020-09-11 DIAGNOSIS — O99013 Anemia complicating pregnancy, third trimester: Secondary | ICD-10-CM

## 2020-09-11 NOTE — Progress Notes (Signed)
   Prenatal Visit Note Date: 09/11/2020 Clinic: [facility]  Subjective:  Casey Meyer is a 40 y.o. [id number] at [redacted]w[redacted]d being seen today for ongoing prenatal care.  She is currently monitored for the following issues for this high-risk pregnancy and has Supervision of high risk pregnancy, antepartum; AMA (advanced maternal age) multigravida 35+; History of COVID-19; History of maternal cervical laceration, currently pregnant; and Fetal abnormality (R CPC, LVEIF) on ultrasound on their problem list.  Patient reports CTS s/s stable.   Contractions: Irritability. Vag. Bleeding: None.  Movement: Present. Denies leaking of fluid.   The following portions of the patient's history were reviewed and updated as appropriate: allergies, current medications, past family history, past medical history, past social history, past surgical history and problem list. Problem list updated.  Objective:   Vitals:   09/11/20 0906  BP: 126/78  Pulse: 94  Weight: 182 lb 3.2 oz (82.6 kg)    Fetal Status: Fetal Heart Rate (bpm): 150   Movement: Present     General:  Alert, oriented and cooperative. Patient is in no acute distress.  Skin: Skin is warm and dry. No rash noted.   Cardiovascular: Normal heart rate noted  Respiratory: Normal respiratory effort, no problems with respiration noted  Abdomen: Soft, gravid, appropriate for gestational age. Pain/Pressure: Present     Pelvic:  Cervical exam deferred        Extremities: Normal range of motion.  Edema: Trace  Mental Status: Normal mood and affect. Normal behavior. Normal judgment and thought content.   Urinalysis:      Assessment and Plan:  Pregnancy: [id number] at [redacted]w[redacted]d  1. Supervision of high risk pregnancy, antepartum Routine care. Vasectomy. Considering BTL or inpatient options. D/w her re: BTL. Paperwork signed to keep that option availalble - Glucose Tolerance, 2 Hours w/1 Hour - CBC - RPR - HIV Antibody (routine testing w  rflx)  2. Multigravida of advanced maternal age in third trimester  3. Fetal abnormality during pregnancy, antepartum, single or unspecified fetus Repeat u/s in early march with mfm  Preterm labor symptoms and general obstetric precautions including but not limited to vaginal bleeding, contractions, leaking of fluid and fetal movement were reviewed in detail with the patient. Please refer to After Visit Summary for other counseling recommendations.  Return in about 2 weeks (around 09/25/2020) for in person or virtual, md or app.   [place] Bing, MD

## 2020-09-12 DIAGNOSIS — O99019 Anemia complicating pregnancy, unspecified trimester: Secondary | ICD-10-CM | POA: Insufficient documentation

## 2020-09-12 DIAGNOSIS — O24419 Gestational diabetes mellitus in pregnancy, unspecified control: Secondary | ICD-10-CM | POA: Insufficient documentation

## 2020-09-12 LAB — HIV ANTIBODY (ROUTINE TESTING W REFLEX): HIV Screen 4th Generation wRfx: NONREACTIVE

## 2020-09-12 LAB — CBC
Hematocrit: 30.4 % — ABNORMAL LOW (ref 34.0–46.6)
Hemoglobin: 10.6 g/dL — ABNORMAL LOW (ref 11.1–15.9)
MCH: 31.7 pg (ref 26.6–33.0)
MCHC: 34.9 g/dL (ref 31.5–35.7)
MCV: 91 fL (ref 79–97)
Platelets: 267 10*3/uL (ref 150–450)
RBC: 3.34 x10E6/uL — ABNORMAL LOW (ref 3.77–5.28)
RDW: 12.6 % (ref 11.7–15.4)
WBC: 16.4 10*3/uL — ABNORMAL HIGH (ref 3.4–10.8)

## 2020-09-12 LAB — RPR: RPR Ser Ql: NONREACTIVE

## 2020-09-12 LAB — GLUCOSE TOLERANCE, 2 HOURS W/ 1HR
Glucose, 1 hour: 193 mg/dL — ABNORMAL HIGH (ref 65–179)
Glucose, 2 hour: 160 mg/dL — ABNORMAL HIGH (ref 65–152)
Glucose, Fasting: 111 mg/dL — ABNORMAL HIGH (ref 65–91)

## 2020-09-12 MED ORDER — DOCUSATE SODIUM 100 MG PO CAPS: 100.0000 mg | ORAL_CAPSULE | Freq: Two times a day (BID) | ORAL | 2 refills | Status: DC | PRN

## 2020-09-12 MED ORDER — FERROUS SULFATE 325 (65 FE) MG PO TABS: 325.0000 mg | ORAL_TABLET | ORAL | 0 refills | Status: DC

## 2020-09-12 NOTE — Addendum Note (Signed)
Addended by: Stowell Bing on: 09/12/2020 09:52 AM   Modules accepted: Orders

## 2020-09-13 ENCOUNTER — Telehealth: Payer: Self-pay | Admitting: *Deleted

## 2020-09-13 DIAGNOSIS — O24419 Gestational diabetes mellitus in pregnancy, unspecified control: Secondary | ICD-10-CM

## 2020-09-13 MED ORDER — ACCU-CHEK SOFTCLIX LANCETS MISC: 1.0000 | Freq: Four times a day (QID) | 12 refills | Status: DC

## 2020-09-13 MED ORDER — ACCU-CHEK GUIDE W/DEVICE KIT: 1.0000 | PACK | Freq: Four times a day (QID) | 0 refills | Status: DC

## 2020-09-13 MED ORDER — ACCU-CHEK GUIDE VI STRP: ORAL_STRIP | 12 refills | Status: DC

## 2020-09-13 NOTE — Telephone Encounter (Signed)
Pt informed of results of GDM and low iron. Informed of meds sent in for her low iron and will send in supplies to check blood sugars and referral to diabetes and nutrition. Explained once she starts checking her blood sugars to keep a log and to bring them in to each appt. Pt verbalizes and understands.

## 2020-09-13 NOTE — Telephone Encounter (Signed)
-----   Message from Elfrida Bing, MD sent at 09/12/2020  9:49 AM EST ----- Can you let her know that she has gdm and set her up with everything for that and also that she has slight anemia and I sent in iron and stool softener for her? Thanks

## 2020-09-24 ENCOUNTER — Ambulatory Visit: Payer: Medicaid Other | Admitting: *Deleted

## 2020-09-25 ENCOUNTER — Other Ambulatory Visit: Payer: Self-pay

## 2020-09-25 ENCOUNTER — Ambulatory Visit (INDEPENDENT_AMBULATORY_CARE_PROVIDER_SITE_OTHER): Payer: Medicaid Other | Admitting: Obstetrics and Gynecology

## 2020-09-25 VITALS — BP 132/73 | HR 90 | Wt 191.8 lb

## 2020-09-25 DIAGNOSIS — O0990 Supervision of high risk pregnancy, unspecified, unspecified trimester: Secondary | ICD-10-CM

## 2020-09-25 DIAGNOSIS — O09523 Supervision of elderly multigravida, third trimester: Secondary | ICD-10-CM

## 2020-09-25 DIAGNOSIS — O99013 Anemia complicating pregnancy, third trimester: Secondary | ICD-10-CM

## 2020-09-25 DIAGNOSIS — O359XX0 Maternal care for (suspected) fetal abnormality and damage, unspecified, not applicable or unspecified: Secondary | ICD-10-CM

## 2020-09-25 DIAGNOSIS — Z3A32 32 weeks gestation of pregnancy: Secondary | ICD-10-CM

## 2020-09-25 DIAGNOSIS — O24419 Gestational diabetes mellitus in pregnancy, unspecified control: Secondary | ICD-10-CM

## 2020-09-25 NOTE — Progress Notes (Signed)
Prenatal Visit Note Date: 09/25/2020 Clinic: [facility]  Subjective:  Casey Meyer is a 40 y.o. [id number] at [redacted]w[redacted]d being seen today for ongoing prenatal care.  She is currently monitored for the following issues for this high-risk pregnancy and has Supervision of high risk pregnancy, antepartum; AMA (advanced maternal age) multigravida 35+; History of COVID-19; History of maternal cervical laceration, currently pregnant; Fetal abnormality (R CPC, LVEIF) on ultrasound; Anemia in pregnancy; and GDM (gestational diabetes mellitus) on their problem list.  Patient reports no complaints.   Contractions: Irritability. Vag. Bleeding: None.  Movement: Present. Denies leaking of fluid.   The following portions of the patient's history were reviewed and updated as appropriate: allergies, current medications, past family history, past medical history, past social history, past surgical history and problem list. Problem list updated.  Objective:   Vitals:   09/25/20 1031  BP: 132/73  Pulse: 90  Weight: 191 lb 12.8 oz (87 kg)    Fetal Status: Fetal Heart Rate (bpm): 154   Movement: Present     General:  Alert, oriented and cooperative. Patient is in no acute distress.  Skin: Skin is warm and dry. No rash noted.   Cardiovascular: Normal heart rate noted  Respiratory: Normal respiratory effort, no problems with respiration noted  Abdomen: Soft, gravid, appropriate for gestational age. Pain/Pressure: Present     Pelvic:  Cervical exam deferred        Extremities: Normal range of motion.  Edema: Trace  Mental Status: Normal mood and affect. Normal behavior. Normal judgment and thought content.   Urinalysis:      Assessment and Plan:  Pregnancy: [id number] at [redacted]w[redacted]d  1. Gestational diabetes mellitus (GDM), antepartum, gestational diabetes method of control unspecified I told her that based on her numbers I recommend she start medications and that insulin is the preferred one given  it's history and known safety profile and she is amenable to starting insulin. She had to miss her dm education visit yesterday due to family reasons; will schedule her to see DM education to go over starting insulin. I told her that if her CBGs stay the same that she may be able to only need a dinner or qhs dose, but will hold off on sending in meds until she sees the educator and what her sugars are.  100s am fasting 2h pp dinner 110s-120s rare 130s and 140s  Will add on a bpp to her growth u/s which is this upcoming week - [place] MFM FETAL BPP WO NON STRESS; Future  2. [redacted] weeks gestation of pregnancy  3. Anemia during pregnancy in third trimester CBC Latest Ref Rng & Units 09/11/2020 05/22/2020 03/16/2020  WBC 3.4 - 10.8 x10E3/uL 16.4(H) 16.5(H) 10.6(H)  Hemoglobin 11.1 - 15.9 g/dL 10.6(L) 12.2 13.2  Hematocrit 34.0 - 46.6 % 30.4(L) 35.0 37.6  Platelets 150 - 450 x10E3/uL 267 258 210     4. Fetal abnormality during pregnancy, antepartum, single or unspecified fetus See above  5. Supervision of high risk pregnancy, antepartum BTL papers signed on 2/22. Pt still considering.   6. Multigravida of advanced maternal age in third trimester  Preterm labor symptoms and general obstetric precautions including but not limited to vaginal bleeding, contractions, leaking of fluid and fetal movement were reviewed in detail with the patient. Please refer to After Visit Summary for other counseling recommendations.  Return in about 2 weeks (around 10/09/2020) for md visit, in person.   [place] Bing, MD

## 2020-09-25 NOTE — Progress Notes (Signed)
Pt c/o constipation. Was not given prescription from pharmacy

## 2020-10-02 ENCOUNTER — Ambulatory Visit: Payer: Medicaid Other | Attending: Obstetrics and Gynecology

## 2020-10-02 ENCOUNTER — Other Ambulatory Visit: Payer: Self-pay

## 2020-10-02 ENCOUNTER — Ambulatory Visit: Payer: Medicaid Other | Admitting: *Deleted

## 2020-10-02 ENCOUNTER — Encounter: Payer: Self-pay | Admitting: *Deleted

## 2020-10-02 ENCOUNTER — Other Ambulatory Visit: Payer: Self-pay | Admitting: *Deleted

## 2020-10-02 DIAGNOSIS — O09523 Supervision of elderly multigravida, third trimester: Secondary | ICD-10-CM | POA: Diagnosis not present

## 2020-10-02 DIAGNOSIS — O24410 Gestational diabetes mellitus in pregnancy, diet controlled: Secondary | ICD-10-CM

## 2020-10-02 DIAGNOSIS — O0990 Supervision of high risk pregnancy, unspecified, unspecified trimester: Secondary | ICD-10-CM | POA: Diagnosis present

## 2020-10-02 DIAGNOSIS — O09522 Supervision of elderly multigravida, second trimester: Secondary | ICD-10-CM | POA: Diagnosis present

## 2020-10-02 DIAGNOSIS — O24419 Gestational diabetes mellitus in pregnancy, unspecified control: Secondary | ICD-10-CM | POA: Insufficient documentation

## 2020-10-02 DIAGNOSIS — Z3A33 33 weeks gestation of pregnancy: Secondary | ICD-10-CM | POA: Diagnosis not present

## 2020-10-04 ENCOUNTER — Encounter: Payer: Self-pay | Admitting: *Deleted

## 2020-10-04 ENCOUNTER — Other Ambulatory Visit: Payer: Self-pay

## 2020-10-04 ENCOUNTER — Encounter: Payer: Medicaid Other | Attending: Obstetrics and Gynecology | Admitting: *Deleted

## 2020-10-04 ENCOUNTER — Encounter: Payer: Self-pay | Admitting: Obstetrics and Gynecology

## 2020-10-04 VITALS — BP 122/70 | Ht 62.0 in | Wt 192.7 lb

## 2020-10-04 DIAGNOSIS — Z3A30 30 weeks gestation of pregnancy: Secondary | ICD-10-CM | POA: Diagnosis not present

## 2020-10-04 DIAGNOSIS — O409XX0 Polyhydramnios, unspecified trimester, not applicable or unspecified: Secondary | ICD-10-CM | POA: Insufficient documentation

## 2020-10-04 DIAGNOSIS — O24419 Gestational diabetes mellitus in pregnancy, unspecified control: Secondary | ICD-10-CM | POA: Diagnosis not present

## 2020-10-04 DIAGNOSIS — O3660X0 Maternal care for excessive fetal growth, unspecified trimester, not applicable or unspecified: Secondary | ICD-10-CM | POA: Insufficient documentation

## 2020-10-04 DIAGNOSIS — O24410 Gestational diabetes mellitus in pregnancy, diet controlled: Secondary | ICD-10-CM

## 2020-10-04 NOTE — Patient Instructions (Addendum)
Read booklet on Gestational Diabetes Follow Gestational Meal Planning Guidelines Don't skip meals Limit fried foods Avoid sugar sweetened drinks (soda, coffee) unless treating a low blood sugar Complete a 3 Day Food Record and bring to next appointment Check blood sugars 4 x day - before breakfast and 2 hrs after every meal and record  Bring blood sugar log to all appointments Purchase urine ketone strips if instructed by MD and check urine ketones every am:  If + increase bedtime snack to 1 protein and 2 carbohydrate servings Walk 20-30 minutes at least 5 x week if permitted by MD  If taking Insulin: Carry fast acting glucose and a snack at all times Rotate injection sites

## 2020-10-05 NOTE — Progress Notes (Signed)
Diabetes Self-Management Education  Visit Type: First/Initial  Appt. Start Time: 1520 Appt. End Time: 1700  10/04/2020  [name], identified by name and date of birth, is a 40 y.o. female with a diagnosis of Diabetes: Gestational Diabetes.   ASSESSMENT  Blood pressure 122/70, height 5\' 2"  (1.575 m), weight 192 lb 11.2 oz (87.4 kg), last menstrual period 02/01/2020, estimated date of delivery 11/20/2020 Body mass index is 35.25 kg/m.   Diabetes Self-Management Education - 10/04/20 1737      Visit Information   Visit Type First/Initial      Initial Visit   Diabetes Type Gestational Diabetes    Are you currently following a meal plan? No    Are you taking your medications as prescribed? Yes    Date Diagnosed 3 weeks ago      Health Coping   How would you rate your overall health? Fair      Psychosocial Assessment   Patient Belief/Attitude about Diabetes Other (comment)   "little irritated"   Self-care barriers None    Self-management support Doctor's office;Family    Patient Concerns Nutrition/Meal planning;Medication;Monitoring;Healthy Lifestyle;Problem Solving;Glycemic Control;Weight Control    Special Needs None    Preferred Learning Style Hands on    Learning Readiness Ready    How often do you need to have someone help you when you read instructions, pamphlets, or other written materials from your doctor or pharmacy? 1 - Never    What is the last grade level you completed in school? GED      Pre-Education Assessment   Patient understands the diabetes disease and treatment process. Needs Instruction    Patient understands incorporating nutritional management into lifestyle. Needs Instruction    Patient undertands incorporating physical activity into lifestyle. Needs Instruction    Patient understands using medications safely. Needs Instruction    Patient understands monitoring blood glucose, interpreting and using results Needs Instruction    Patient  understands prevention, detection, and treatment of acute complications. Needs Instruction    Patient understands prevention, detection, and treatment of chronic complications. Needs Instruction    Patient understands how to develop strategies to address psychosocial issues. Needs Instruction    Patient understands how to develop strategies to promote health/change behavior. Needs Instruction      Complications   Last HgB A1C per patient/outside source 5 %   05/22/2020   How often do you check your blood sugar? 3-4 times/day    Fasting Blood glucose range (mg/dL) 13/08/2019   FBG's 93-137 mg/dL 6/9 out of range   Postprandial Blood glucose range (mg/dL) 8/9   pp breakfast 98-150 mg/dL 5/8 out of range; pp lunch 89-132 mg/dL 2/8 out of range; pp supper 108-140 mg/dL 5/7 out of range   Have you had a dilated eye exam in the past 12 months? No    Have you had a dental exam in the past 12 months? No    Are you checking your feet? No      Dietary Intake   Breakfast egg sandwich; bagel with cream cheese; powdered donut; (cereal and milk - but this was stopped due to BG)    Snack (morning) 2 snacks/day - chips, cuccumbers and ranch    Lunch skips    Snack (afternoon) strawberries, grapes, banana    Dinner chicken, beef; potaotes, corn, green beans, pasta, lettuce, carrots, cuccumbers    Beverage(s) water, regular soda - 2 to 3 x day; coffee 1-2 x week with sugar  Exercise   Exercise Type ADL's      Patient Education   Previous Diabetes Education No    Disease state  Definition of diabetes, type 1 and 2, and the diagnosis of diabetes;Factors that contribute to the development of diabetes    Nutrition management  Role of diet in the treatment of diabetes and the relationship between the three main macronutrients and blood glucose level;Food label reading, portion sizes and measuring food.;Reviewed blood glucose goals for pre and post meals and how to evaluate the patients' food  intake on their blood glucose level.    Physical activity and exercise  Role of exercise on diabetes management, blood pressure control and cardiac health.    Medications Taught/reviewed insulin injection, site rotation, insulin storage and needle disposal.;Reviewed patients medication for diabetes, action, purpose, timing of dose and side effects.;Other (comment)   Pt reports MD is going to start insulin. She injected 5 units NS to left abdomen subcutaneously without difficulty. Discussed use of N insulin or long acting insulin. MD hasn't ordered this yet.    Monitoring Purpose and frequency of SMBG.;Taught/discussed recording of test results and interpretation of SMBG.;Identified appropriate SMBG and/or A1C goals.;Ketone testing, when, how.    Acute complications Taught treatment of hypoglycemia - the 15 rule.    Chronic complications Relationship between chronic complications and blood glucose control    Psychosocial adjustment Identified and addressed patients feelings and concerns about diabetes    Preconception care Pregnancy and GDM  Role of pre-pregnancy blood glucose control on the development of the fetus;Reviewed with patient blood glucose goals with pregnancy;Role of family planning for patients with diabetes      Individualized Goals (developed by patient)   Reducing Risk Other (comment)   improve blood sugars, decrease medications, prevent diabetes complications, lose weight, lead a healthier lifestyle, become more fit     Outcomes   Expected Outcomes Demonstrated interest in learning. Expect positive outcomes           Individualized Plan for Diabetes Self-Management Training:   Learning Objective:  Patient will have a greater understanding of diabetes self-management. Patient education plan is to attend individual and/or group sessions per assessed needs and concerns.   Plan:   Patient Instructions  Read booklet on Gestational Diabetes Follow Gestational Meal Planning  Guidelines Don't skip meals Limit fried foods Avoid sugar sweetened drinks (soda, coffee) unless treating a low blood sugar Complete a 3 Day Food Record and bring to next appointment Check blood sugars 4 x day - before breakfast and 2 hrs after every meal and record  Bring blood sugar log to all appointments Purchase urine ketone strips if instructed by MD and check urine ketones every am:  If + increase bedtime snack to 1 protein and 2 carbohydrate servings Walk 20-30 minutes at least 5 x week if permitted by MD  If taking Insulin: Carry fast acting glucose and a snack at all times Rotate injection sites  Expected Outcomes:  Demonstrated interest in learning. Expect positive outcomes  Education material provided:  Gestational Booklet Gestational Meal Planning Guidelines Simple Meal Plan 3 Day Food Record Goals for a Healthy Pregnancy Injection Guide (BD) Glucose tablets Symptoms, causes and treatments of Hypoglycemia  If problems or questions, patient to contact team via:  Sharion Settler, RN, CCM, CDCES [phone]  Future DSME appointment:  October 11, 2020 with the dietitian

## 2020-10-08 ENCOUNTER — Encounter: Payer: Self-pay | Admitting: *Deleted

## 2020-10-08 ENCOUNTER — Other Ambulatory Visit: Payer: Self-pay

## 2020-10-08 ENCOUNTER — Ambulatory Visit: Payer: Medicaid Other | Attending: Obstetrics

## 2020-10-08 ENCOUNTER — Ambulatory Visit: Payer: Medicaid Other | Admitting: *Deleted

## 2020-10-08 DIAGNOSIS — Z3A33 33 weeks gestation of pregnancy: Secondary | ICD-10-CM | POA: Diagnosis not present

## 2020-10-08 DIAGNOSIS — O09523 Supervision of elderly multigravida, third trimester: Secondary | ICD-10-CM

## 2020-10-08 DIAGNOSIS — O0990 Supervision of high risk pregnancy, unspecified, unspecified trimester: Secondary | ICD-10-CM

## 2020-10-08 DIAGNOSIS — O24419 Gestational diabetes mellitus in pregnancy, unspecified control: Secondary | ICD-10-CM | POA: Diagnosis not present

## 2020-10-09 ENCOUNTER — Encounter: Payer: Self-pay | Admitting: Obstetrics & Gynecology

## 2020-10-09 ENCOUNTER — Ambulatory Visit (INDEPENDENT_AMBULATORY_CARE_PROVIDER_SITE_OTHER): Payer: Medicaid Other | Admitting: Obstetrics & Gynecology

## 2020-10-09 ENCOUNTER — Other Ambulatory Visit: Payer: Self-pay

## 2020-10-09 VITALS — BP 126/71 | HR 85 | Wt 192.6 lb

## 2020-10-09 DIAGNOSIS — O409XX0 Polyhydramnios, unspecified trimester, not applicable or unspecified: Secondary | ICD-10-CM

## 2020-10-09 DIAGNOSIS — O24419 Gestational diabetes mellitus in pregnancy, unspecified control: Secondary | ICD-10-CM

## 2020-10-09 DIAGNOSIS — Z3A34 34 weeks gestation of pregnancy: Secondary | ICD-10-CM

## 2020-10-09 DIAGNOSIS — O3663X0 Maternal care for excessive fetal growth, third trimester, not applicable or unspecified: Secondary | ICD-10-CM

## 2020-10-09 DIAGNOSIS — O0990 Supervision of high risk pregnancy, unspecified, unspecified trimester: Secondary | ICD-10-CM

## 2020-10-09 DIAGNOSIS — O09523 Supervision of elderly multigravida, third trimester: Secondary | ICD-10-CM

## 2020-10-09 MED ORDER — METFORMIN HCL 500 MG PO TABS: 500.0000 mg | ORAL_TABLET | Freq: Every day | ORAL | 1 refills | Status: DC

## 2020-10-09 NOTE — Patient Instructions (Signed)
Return to office for any scheduled appointments. Call the office or go to the MAU at [facility] if:  You begin to have strong, frequent contractions  Your water breaks.  Sometimes it is a big gush of fluid, sometimes it is just a trickle that keeps getting your panties wet or running down your legs  You have vaginal bleeding.  It is normal to have a small amount of spotting if your cervix was checked.   You do not feel your baby moving like normal.  If you do not, get something to eat and drink and lay down and focus on feeling your baby move.   If your baby is still not moving like normal, you should call the office or go to MAU.  Any other obstetric concerns.   

## 2020-10-09 NOTE — Progress Notes (Signed)
PRENATAL VISIT NOTE  Subjective:  Casey Meyer is a 40 y.o. [id number] at [redacted]w[redacted]d being seen today for ongoing prenatal care.  She is currently monitored for the following issues for this high-risk pregnancy and has Supervision of high risk pregnancy, antepartum; AMA (advanced maternal age) multigravida 35+; History of COVID-19; History of maternal cervical laceration, currently pregnant; Fetal abnormality (R CPC, LVEIF) on ultrasound; Anemia in pregnancy; GDM (gestational diabetes mellitus); LGA (large for gestational age) fetus affecting management of mother; and Polyhydramnios affecting pregnancy on their problem list.   Patient reports no complaints.  Contractions: Irritability. Vag. Bleeding: None.  Movement: Present. Denies leaking of fluid.   The following portions of the patient's history were reviewed and updated as appropriate: allergies, current medications, past family history, past medical history, past social history, past surgical history and problem list.   Objective:   Vitals:   10/09/20 0946  BP: 126/71  Pulse: 85  Weight: 192 lb 9.6 oz (87.4 kg)    Fetal Status: Fetal Heart Rate (bpm): 134   Movement: Present     General:  Alert, oriented and cooperative. Patient is in no acute distress.  Skin: Skin is warm and dry. No rash noted.   Cardiovascular: Normal heart rate noted  Respiratory: Normal respiratory effort, no problems with respiration noted  Abdomen: Soft, gravid, appropriate for gestational age.  Pain/Pressure: Present     Pelvic: Cervical exam deferred        Extremities: Normal range of motion.     Mental Status: Normal mood and affect. Normal behavior. Normal judgment and thought content.    Imaging: [place] MFM FETAL BPP WO NON STRESS  Result Date: 10/08/2020 ----------------------------------------------------------------------  OBSTETRICS REPORT                       (Signed Final 10/08/2020 04:09 pm)  ---------------------------------------------------------------------- Patient Info  ID #:       [id number]                          D.O.B.:  1980/09/24 (39 yrs)  Name:       Casey Meyer                   Visit Date: 10/08/2020 03:30 pm ---------------------------------------------------------------------- Performed By  Attending:        Noralee Space MD        Ref. Address:     [address]                                                             [address]  Performed By:     Fayne Norrie BS,      [facility]                    RD[facility], RVT                                [facility]  [facility] [place] [place] ---------------------------------------------------------------------- Orders  #  Description                           Code        Ordered By  1  US MFM FETAL BPP WO NON               [id number]    YU FANG     STRESS ----------------------------------------------------------------------  #  Order #                     Accession #                Episode #  1  [id number]                   [id number]                 [id number] ---------------------------------------------------------------------- Indications  Gestational diabetes in pregnancy,             O24.419  unspecified control  Advanced maternal age multigravida 10935+,        47O09.523  third trimester  7333 weeks gestation of pregnancy                Z3A.33 ---------------------------------------------------------------------- Fetal Evaluation  Num Of Fetuses:         1  Fetal Heart Rate(bpm):  132  Cardiac Activity:       Observed  Presentation:           Cephalic  Placenta:               Posterior  P. Cord Insertion:      Previously Visualized  Amniotic Fluid  AFI FV:      Within normal limits  AFI Sum(cm)     %Tile       Largest Pocket(cm)  20.6            77          6.79  RUQ(cm)        RLQ(cm)       LUQ(cm)        LLQ(cm)  5.29          3.02          5.5            6.79 ---------------------------------------------------------------------- Biophysical Evaluation  Amniotic F.V:   Pocket => 2 cm             F. Tone:        Observed  F. Movement:    Observed                   Score:          8/8  F. Breathing:   Observed ---------------------------------------------------------------------- Biometry  LV:        4.4  mm ---------------------------------------------------------------------- OB History  Blood Type:   A+  Gravidity:    5         Term:   2  Prem:   0        SAB:   2  TOP:          0       Ectopic:  0        Living: 2 ---------------------------------------------------------------------- Gestational Age  LMP:           35w 5d        Date:  02/01/20                 EDD:   11/07/20  Best:          33w 6d     Det. By:  U/S  (06/26/20)          EDD:   11/20/20 ---------------------------------------------------------------------- Anatomy  Lips:                  Appears normal         Kidneys:                Appear normal  Stomach:               Appears normal, left   Bladder:                Appears normal                         sided  Cord Vessels:          Appears normal (3                         vessel cord) ---------------------------------------------------------------------- Impression  Gestational diabetes. Reportedly not well controlled on diet.  Patient is considering insulin as advised by her physician.  Amniotic fluid is normal and good fetal activity is seen  .Antenatal testing is reassuring. BPP 8/8. ---------------------------------------------------------------------- Recommendations  -Continue weekly BPP till delivery. ----------------------------------------------------------------------                  Noralee Space, MD Electronically Signed Final Report   10/08/2020 04:09 pm ----------------------------------------------------------------------  [place] MFM FETAL BPP WO  NON STRESS  Result Date: 10/02/2020 ----------------------------------------------------------------------  OBSTETRICS REPORT                       (Signed Final 10/02/2020 12:33 pm) ---------------------------------------------------------------------- Patient Info  ID #:       [id number]                          D.O.B.:  05/02/1981 (39 yrs)  Name:       Casey Meyer                   Visit Date: 10/02/2020 10:50 am ---------------------------------------------------------------------- Performed By  Attending:        Ma Rings MD         Ref. Address:     [address]                                                             [address]  Performed By:     Hurman Horn          Location[facility]  [facility]                    RDMS                                     [facility]  Referred By:      [facility] ---------------------------------------------------------------------- Orders  #  Description                           Code        Ordered By  1  [place] MFM OB FOLLOW UP                   [id number]    Lin Landsman  2  [place] MFM FETAL BPP WO NON               [id number]    CHARLIE PICKENS     STRESS ----------------------------------------------------------------------  #  Order #                     Accession #                Episode #  1  [id number]                   [id number]                 [id number]  2  [id number]                   [id number]                 [id number] ---------------------------------------------------------------------- Indications  Gestational diabetes in pregnancy, diet        O24.410  controlled  [redacted] weeks gestation of pregnancy                Z3A.77  Advanced maternal age multigravida 89+,        O59.523  third trimester  ---------------------------------------------------------------------- Fetal Evaluation  Num Of Fetuses:         1  Fetal Heart Rate(bpm):  152  Cardiac Activity:       Observed  Presentation:           Cephalic  Placenta:  Anterior  Amniotic Fluid  AFI FV:      Within normal limits  AFI Sum(cm)     %Tile       Largest Pocket(cm)  20.22           76          8.8  RUQ(cm)       RLQ(cm)       LUQ(cm)        LLQ(cm)  8.8           4.4           3.6            3.42 ---------------------------------------------------------------------- Biophysical Evaluation  Amniotic F.V:   Within normal limits       F. Tone:        Observed  F. Movement:    Observed                   Score:          8/8  F. Breathing:   Observed ---------------------------------------------------------------------- Biometry  BPD:      84.9  mm     G. Age:  34w 1d         78  %    CI:        71.66   %    70 - 86                                                          FL/HC:      19.7   %    19.9 - 21.5  HC:      319.3  mm     G. Age:  36w 0d         86  %    HC/AC:      0.96        0.96 - 1.11  AC:      332.4  mm     G. Age:  37w 1d       > 99  %    FL/BPD:     74.1   %    71 - 87  FL:       62.9  mm     G. Age:  32w 4d         26  %    FL/AC:      18.9   %    20 - 24  HUM:      55.4  mm     G. Age:  32w 2d         43  %  LV:        4.5  mm  Est. FW:    2709  gm           6 lb     98  % ---------------------------------------------------------------------- OB History  Blood Type:   A+  Gravidity:    5         Term:   2        Prem:   0        SAB:   2  TOP:          0       Ectopic:  0  Living: 2 ---------------------------------------------------------------------- Gestational Age  LMP:           34w 6d        Date:  02/01/20                 EDD:   11/07/20  U/S Today:     35w 0d                                        EDD:   11/06/20  Best:          33w 0d     Det. By:  U/S  (06/26/20)          EDD:   11/20/20  ---------------------------------------------------------------------- Cervix Uterus Adnexa  Cervix  Length:           3.55  cm.  Normal appearance by transabdominal scan.  Uterus  No abnormality visualized.  Right Ovary  Not visualized.  Left Ovary  Not visualized.  Adnexa  No abnormality visualized. ---------------------------------------------------------------------- Comments  This patient was seen for a follow up growth scan due to  recently diagnosed gestational diabetes.  The patient reports  that she may have to be placed on medication treatment for  her gestational diabetes soon.  She was informed that the fetal growth measures large for  her gestational age (98th percentile).  Borderline  polyhydramnios is noted today with an MVP of >8 cm.  However, the total AFI of 20.2 cm is within normal limits.  A biophysical profile performed today was 8 out of 8.  The implications and management of diabetes in pregnancy  was discussed in detail with the patient. She was advised that  our goals for her fingerstick values are fasting values of 90-95  or less and two-hour postprandials of 120 or less.  Should the  majority of her fingerstick values be above these values, she  may have to be started on insulin or metformin to help her  achieve better glycemic control. The patient was advised that  getting her fingerstick values as close to these goals as  possible would provide her with the most optimal obstetrical  outcome.  The patient was advised that obtaining optimal  glycemic control will hopefully prevent fetal macrosomia and  an indicated cesarean delivery.  We will continue to follow her closely with weekly biophysical  profiles.  A biophysical profile scheduled in 1 week.  We will reassess  the fetal growth closer to delivery in 4 weeks. ----------------------------------------------------------------------                   Ma Rings, MD Electronically Signed Final Report   10/02/2020 12:33 pm  ----------------------------------------------------------------------  [place] MFM OB FOLLOW UP  Result Date: 10/02/2020 ----------------------------------------------------------------------  OBSTETRICS REPORT                       (Signed Final 10/02/2020 12:33 pm) ---------------------------------------------------------------------- Patient Info  ID #:       [id number]                          D.O.B.:  May 20, 1981 (39 yrs)  Name:       Casey Meyer                   Visit Date: 10/02/2020 10:50 am ---------------------------------------------------------------------- Performed By  Attending:  Ma Rings MD         Ref. Address:     [address]                                                             [address]  Performed By:     Hurman Horn          [facility]                    RD[facility]                                     [facility]  Referred By:      [facility] ---------------------------------------------------------------------- Orders  #  Description                           Code        Ordered By  1  [place] MFM OB FOLLOW UP                   [id number]    Lin Landsman  2  [place] MFM FETAL BPP WO NON               [id number]    [facility]     STRESS ----------------------------------------------------------------------  #  Order #                     Accession #                Episode #  1  [id number]                   [id number]                 [id number]  2  [id number]                   [id number]                 [id number] ---------------------------------------------------------------------- Indications  Gestational diabetes in pregnancy, diet  O24.410  controlled  [redacted] weeks gestation of pregnancy                Z3A.36  Advanced maternal age multigravida 59+,        O34.523   third trimester ---------------------------------------------------------------------- Fetal Evaluation  Num Of Fetuses:         1  Fetal Heart Rate(bpm):  152  Cardiac Activity:       Observed  Presentation:           Cephalic  Placenta:               Anterior  Amniotic Fluid  AFI FV:      Within normal limits  AFI Sum(cm)     %Tile       Largest Pocket(cm)  20.22           76          8.8  RUQ(cm)       RLQ(cm)       LUQ(cm)        LLQ(cm)  8.8           4.4           3.6            3.42 ---------------------------------------------------------------------- Biophysical Evaluation  Amniotic F.V:   Within normal limits       F. Tone:        Observed  F. Movement:    Observed                   Score:          8/8  F. Breathing:   Observed ---------------------------------------------------------------------- Biometry  BPD:      84.9  mm     G. Age:  34w 1d         78  %    CI:        71.66   %    70 - 86                                                          FL/HC:      19.7   %    19.9 - 21.5  HC:      319.3  mm     G. Age:  36w 0d         86  %    HC/AC:      0.96        0.96 - 1.11  AC:      332.4  mm     G. Age:  37w 1d       > 99  %    FL/BPD:     74.1   %    71 - 87  FL:       62.9  mm     G. Age:  32w 4d         26  %    FL/AC:      18.9   %    20 - 24  HUM:      55.4  mm     G. Age:  32w 2d         43  %  LV:        4.5  mm  Est. FW:    2709  gm           6 lb     98  % ---------------------------------------------------------------------- OB History  Blood Type:   A+  Gravidity:    5         Term:   2        Prem:   0        SAB:   2  TOP:          0       Ectopic:  0        Living: 2 ---------------------------------------------------------------------- Gestational Age  LMP:           34w 6d        Date:  02/01/20                 EDD:   11/07/20  U/S Today:     35w 0d                                        EDD:   11/06/20  Best:          33w 0d     Det. By:  U/S  (06/26/20)          EDD:   11/20/20  ---------------------------------------------------------------------- Cervix Uterus Adnexa  Cervix  Length:           3.55  cm.  Normal appearance by transabdominal scan.  Uterus  No abnormality visualized.  Right Ovary  Not visualized.  Left Ovary  Not visualized.  Adnexa  No abnormality visualized. ---------------------------------------------------------------------- Comments  This patient was seen for a follow up growth scan due to  recently diagnosed gestational diabetes.  The patient reports  that she may have to be placed on medication treatment for  her gestational diabetes soon.  She was informed that the fetal growth measures large for  her gestational age (98th percentile).  Borderline  polyhydramnios is noted today with an MVP of >8 cm.  However, the total AFI of 20.2 cm is within normal limits.  A biophysical profile performed today was 8 out of 8.  The implications and management of diabetes in pregnancy  was discussed in detail with the patient. She was advised that  our goals for her fingerstick values are fasting values of 90-95  or less and two-hour postprandials of 120 or less.  Should the  majority of her fingerstick values be above these values, she  may have to be started on insulin or metformin to help her  achieve better glycemic control. The patient was advised that  getting her fingerstick values as close to these goals as  possible would provide her with the most optimal obstetrical  outcome.  The patient was advised that obtaining optimal  glycemic control will hopefully prevent fetal macrosomia and  an indicated cesarean delivery.  We will continue to follow her closely with weekly biophysical  profiles.  A biophysical profile scheduled in 1 week.  We will reassess  the fetal growth closer to delivery in 4 weeks. ----------------------------------------------------------------------                   Ma Rings, MD Electronically Signed Final Report   10/02/2020 12:33 pm  ----------------------------------------------------------------------   Assessment and Plan:  Pregnancy: [id number] at [redacted]w[redacted]d 1. Gestational diabetes mellitus (GDM) in third trimester,  gestational diabetes method of control unspecified Fasting values 90-146, 2 hr PP mostly within range. Discussed insulin therapy, also offered Metfromin. She wants to try Metformin for now, will adjust dosage accordingly. - metFORMIN (GLUCOPHAGE) 500 MG tablet; Take 1 tablet (500 mg total) by mouth at bedtime.  Dispense: 30 tablet; Refill: 1  2. Excessive fetal growth affecting management of pregnancy in third trimester, single or unspecified fetus 3. Polyhydramnios affecting pregnancy 4. Multigravida of advanced maternal age in third trimester Follow up MFM antenatal testing, scans and recommendations.  5. [redacted] weeks gestation of pregnancy 6. Supervision of high risk pregnancy, antepartum Preterm labor symptoms and general obstetric precautions including but not limited to vaginal bleeding, contractions, leaking of fluid and fetal movement were reviewed in detail with the patient. Please refer to After Visit Summary for other counseling recommendations.   Return in about 1 week (around 10/16/2020) for OFFICE OB VISIT (MD only) -- can be virtual.  Future Appointments  Date Time Provider Department Center  10/11/2020  9:30 AM Darrick Grinder, RD [facility] None  10/16/2020  3:15 PM [facility] NURSE [facility]  10/16/2020  3:30 PM [facility] US3 [facility]  10/23/2020 10:30 AM [facility]  10/23/2020 10:45 AM [facility] US5 [facility]  10/30/2020 10:00 AM [facility] NURSE [facility]  10/30/2020 10:30 AM [facility] US3 [facility]    Jaynie Collins, MD

## 2020-10-11 ENCOUNTER — Ambulatory Visit: Payer: Medicaid Other | Admitting: Dietician

## 2020-10-12 ENCOUNTER — Telehealth: Payer: Self-pay | Admitting: Dietician

## 2020-10-12 NOTE — Telephone Encounter (Signed)
Called patient to reschedule her missed appointment from 10/11/20. Left a voicemail message requesting a call back.

## 2020-10-16 ENCOUNTER — Encounter: Payer: Self-pay | Admitting: *Deleted

## 2020-10-16 ENCOUNTER — Other Ambulatory Visit: Payer: Self-pay

## 2020-10-16 ENCOUNTER — Ambulatory Visit: Payer: Medicaid Other | Attending: Obstetrics

## 2020-10-16 ENCOUNTER — Ambulatory Visit: Payer: Medicaid Other | Admitting: *Deleted

## 2020-10-16 DIAGNOSIS — O0990 Supervision of high risk pregnancy, unspecified, unspecified trimester: Secondary | ICD-10-CM | POA: Insufficient documentation

## 2020-10-16 DIAGNOSIS — Z3A35 35 weeks gestation of pregnancy: Secondary | ICD-10-CM

## 2020-10-16 DIAGNOSIS — O24419 Gestational diabetes mellitus in pregnancy, unspecified control: Secondary | ICD-10-CM | POA: Insufficient documentation

## 2020-10-16 DIAGNOSIS — O09523 Supervision of elderly multigravida, third trimester: Secondary | ICD-10-CM

## 2020-10-16 DIAGNOSIS — O24415 Gestational diabetes mellitus in pregnancy, controlled by oral hypoglycemic drugs: Secondary | ICD-10-CM | POA: Diagnosis not present

## 2020-10-18 ENCOUNTER — Encounter: Payer: Self-pay | Admitting: Dietician

## 2020-10-18 ENCOUNTER — Encounter: Payer: Self-pay | Admitting: Obstetrics & Gynecology

## 2020-10-18 ENCOUNTER — Telehealth (INDEPENDENT_AMBULATORY_CARE_PROVIDER_SITE_OTHER): Payer: Medicaid Other | Admitting: Obstetrics & Gynecology

## 2020-10-18 DIAGNOSIS — O0990 Supervision of high risk pregnancy, unspecified, unspecified trimester: Secondary | ICD-10-CM

## 2020-10-18 DIAGNOSIS — O3663X0 Maternal care for excessive fetal growth, third trimester, not applicable or unspecified: Secondary | ICD-10-CM

## 2020-10-18 DIAGNOSIS — Z7984 Long term (current) use of oral hypoglycemic drugs: Secondary | ICD-10-CM

## 2020-10-18 DIAGNOSIS — Z3A35 35 weeks gestation of pregnancy: Secondary | ICD-10-CM

## 2020-10-18 DIAGNOSIS — O24415 Gestational diabetes mellitus in pregnancy, controlled by oral hypoglycemic drugs: Secondary | ICD-10-CM

## 2020-10-18 DIAGNOSIS — O163 Unspecified maternal hypertension, third trimester: Secondary | ICD-10-CM

## 2020-10-18 DIAGNOSIS — O0993 Supervision of high risk pregnancy, unspecified, third trimester: Secondary | ICD-10-CM

## 2020-10-18 DIAGNOSIS — O09293 Supervision of pregnancy with other poor reproductive or obstetric history, third trimester: Secondary | ICD-10-CM

## 2020-10-18 NOTE — Progress Notes (Signed)
Have not heard back from patient to reschedule her missed appointment from 10/11/20. Sent notification to referring provider.

## 2020-10-18 NOTE — Progress Notes (Signed)
OBSTETRICS PRENATAL VIRTUAL VISIT ENCOUNTER NOTE  Provider location: [facility]   Patient location: Home  I connected with Casey Meyer on 10/18/20 at  9:15 AM EDT by MyChart Video Encounter and verified that I am speaking with the correct person using two identifiers.   I discussed the limitations, risks, security and privacy concerns of performing an evaluation and management service virtually and the availability of in person appointments. I also discussed with the patient that there may be a patient responsible charge related to this service. The patient expressed understanding and agreed to proceed. Subjective:  Casey Meyer is a 40 y.o. Z6X0960 at [redacted]w[redacted]d being seen today for ongoing prenatal care.  She is currently monitored for the following issues for this high-risk pregnancy and has Supervision of high risk pregnancy, antepartum; AMA (advanced maternal age) multigravida 35+; History of COVID-19; History of maternal cervical laceration, currently pregnant; Fetal abnormality (R CPC, LVEIF) on ultrasound; Anemia in pregnancy; GDM (gestational diabetes mellitus); LGA (large for gestational age) fetus affecting management of mother; and Hx of preeclampsia, prior pregnancy, currently pregnant, third trimester on their problem list.  Patient reports having 7/10 headache last night, alleviated by cool compress to head. Did not take any medication, does "not want to hurt the baby".  Patient denies any visual symptoms, RUQ/epigastric pain. She does report occasional SOB which she attributes to enlarging uterus with LGA fetus.   Contractions: Irregular. Vag. Bleeding: None.  Movement: Present. Denies any leaking of fluid.   The following portions of the patient's history were reviewed and updated as appropriate: allergies, current medications, past family history, past medical history, past social history, past surgical history and problem list.   Objective:   10/17/20 BP at MFM was 149/77 (first elevated BP this pregnancy)  Fetal Status:     Movement: Present     General:  Alert, oriented and cooperative. Patient is in no acute distress.  Respiratory: Normal respiratory effort, no problems with respiration noted  Mental Status: Normal mood and affect. Normal behavior. Normal judgment and thought content.  Rest of physical exam deferred due to type of encounter  Imaging: [place] MFM FETAL BPP WO NON STRESS  Result Date: 10/16/2020 ----------------------------------------------------------------------  OBSTETRICS REPORT                       (Signed Final 10/16/2020 05:23 pm) ---------------------------------------------------------------------- Patient Info  ID #:       [id number]                          D.O.B.:  02-Jan-1981 (39 yrs)  Name:       Casey Meyer                   Visit Date: 10/16/2020 03:25 pm ---------------------------------------------------------------------- Performed By  Attending:        Ma Rings MD         Ref. Address:     [address]                                                             [address]  Performed By:     Sandi Mealy        [facility]  Referred By:      [facility] ---------------------------------------------------------------------- Orders  #  Description                           Code        Ordered By  1  [place] MFM FETAL BPP WO NON               [phone]    YU FANG     STRESS ----------------------------------------------------------------------  #  Order #                     Accession #                Episode #  1  [id number]                   [id number]                 [id number] ---------------------------------------------------------------------- Indications  Gestational  diabetes in pregnancy,             O24.415  controlled by oral hypoglycemic drugs  Advanced maternal age multigravida 11+,        O103.523  third trimester  [redacted] weeks gestation of pregnancy                Z3A.35 ---------------------------------------------------------------------- Fetal Evaluation  Num Of Fetuses:         1  Fetal Heart Rate(bpm):  122  Cardiac Activity:       Observed  Amniotic Fluid  AFI FV:      Within normal limits  AFI Sum(cm)     %Tile       Largest Pocket(cm)  20.81           78          6.03  RUQ(cm)       RLQ(cm)       LUQ(cm)        LLQ(cm)  6.03          4.87          5.49           4.42 ---------------------------------------------------------------------- Biophysical Evaluation  Amniotic F.V:   Pocket => 2 cm             F. Tone:        Observed  F. Movement:    Observed                   Score:          8/8  F. Breathing:   Observed ---------------------------------------------------------------------- OB History  Blood Type:   A+  Gravidity:    5         Term:   2        Prem:   0        SAB:   2  TOP:          0       Ectopic:  0        Living: 2 ---------------------------------------------------------------------- Gestational Age  LMP:           36w 6d        Date:  02/01/20                 EDD:   11/07/20  Best:          Consuello Closs 0d     Det. By:  U/S  (06/26/20)          EDD:   11/20/20 ---------------------------------------------------------------------- Comments  This patient was seen for a biophysical profile due to  gestational diabetes that is currently treated with Metformin.  She denies any problems since her last exam.  A biophysical profile performed today was 8 out of 8.  There was normal amniotic fluid noted on today's ultrasound  exam.  She will return in 1 week for another biophysical profile. ----------------------------------------------------------------------                   Ma Rings, MD Electronically Signed Final Report   10/16/2020 05:23 pm  ----------------------------------------------------------------------  [place] MFM FETAL BPP WO NON STRESS  Result Date: 10/08/2020 ----------------------------------------------------------------------  OBSTETRICS REPORT                       (Signed Final 10/08/2020 04:09 pm) ---------------------------------------------------------------------- Patient Info  ID #:       [id number]                          D.O.B.:  07-29-1980 (39 yrs)  Name:       Casey Meyer                   Visit Date: 10/08/2020 03:30 pm ---------------------------------------------------------------------- Performed By  Attending:        Noralee Space MD        Ref. Address:     [address]                                                             [address]  Performed By:     Fayne Norrie BS,      [facility] RVT                                [facility]  Referred By:      [facility] ---------------------------------------------------------------------- Orders  #  Description                           Code        Ordered By  1  [place] MFM FETAL BPP WO NON               [id number]    YU FANG     STRESS ----------------------------------------------------------------------  #  Order #                     Accession #                Episode #  1  [id number]                   [id number]                 [id number] ---------------------------------------------------------------------- Indications  Gestational diabetes in pregnancy,             O24.419  unspecified control  Advanced maternal age multigravida 63+,        O33.523  third trimester  [redacted] weeks gestation of pregnancy                Z3A.33 ---------------------------------------------------------------------- Fetal Evaluation  Num Of Fetuses:         1  Fetal Heart Rate(bpm):  132  Cardiac Activity:        Observed  Presentation:           Cephalic  Placenta:               Posterior  P. Cord Insertion:      Previously Visualized  Amniotic Fluid  AFI FV:      Within normal limits  AFI Sum(cm)     %Tile       Largest Pocket(cm)  20.6            77          6.79  RUQ(cm)       RLQ(cm)       LUQ(cm)        LLQ(cm)  5.29          3.02          5.5            6.79 ---------------------------------------------------------------------- Biophysical Evaluation  Amniotic F.V:   Pocket => 2 cm             F. Tone:        Observed  F. Movement:    Observed                   Score:          8/8  F. Breathing:   Observed ---------------------------------------------------------------------- Biometry  LV:        4.4  mm ---------------------------------------------------------------------- OB History  Blood Type:   A+  Gravidity:    5         Term:   2  Prem:   0        SAB:   2  TOP:          0       Ectopic:  0        Living: 2 ---------------------------------------------------------------------- Gestational Age  LMP:           35w 5d        Date:  02/01/20                 EDD:   11/07/20  Best:          33w 6d     Det. By:  U/S  (06/26/20)          EDD:   11/20/20 ---------------------------------------------------------------------- Anatomy  Lips:                  Appears normal         Kidneys:                Appear normal  Stomach:               Appears normal, left   Bladder:                Appears normal                         sided  Cord Vessels:          Appears normal (3                         vessel cord) ---------------------------------------------------------------------- Impression  Gestational diabetes. Reportedly not well controlled on diet.  Patient is considering insulin as advised by her physician.  Amniotic fluid is normal and good fetal activity is seen  .Antenatal testing is reassuring. BPP 8/8. ---------------------------------------------------------------------- Recommendations  -Continue weekly  BPP till delivery. ----------------------------------------------------------------------                  Noralee Space, MD Electronically Signed Final Report   10/08/2020 04:09 pm ----------------------------------------------------------------------  [place] MFM FETAL BPP WO NON STRESS  Result Date: 10/02/2020 ----------------------------------------------------------------------  OBSTETRICS REPORT                       (Signed Final 10/02/2020 12:33 pm) ---------------------------------------------------------------------- Patient Info  ID #:       [id number]                          D.O.B.:  [birth year] (39 yrs)  Name:       Casey Meyer                   Visit Date: 10/02/2020 10:50 am ---------------------------------------------------------------------- Performed By  Attending:        Ma Rings MD         Ref. Address:     [address]                                                             [address]  Performed By:     Hurman Horn          [facility]                    RD[facility]                                     [facility]  Referred By:      [facility] ---------------------------------------------------------------------- Orders  #  Description                           Code        Ordered By  1  [place] MFM OB FOLLOW UP                   [id number]    Lin Landsman  2  [place] MFM FETAL BPP WO NON               [id number]    CHARLIE PICKENS     STRESS ----------------------------------------------------------------------  #  Order #                     Accession #                Episode #  1  [id number]                   [id number]                 [id number]  2  [id number]                   [id number]                 [id number]  ---------------------------------------------------------------------- Indications  Gestational diabetes in pregnancy, diet        O24.410  controlled  [redacted] weeks gestation of pregnancy                Z3A.6  Advanced maternal age multigravida 42+,        O15.523  third trimester ---------------------------------------------------------------------- Fetal Evaluation  Num Of Fetuses:         1  Fetal Heart Rate(bpm):  152  Cardiac Activity:       Observed  Presentation:           Cephalic  Placenta:  Anterior  Amniotic Fluid  AFI FV:      Within normal limits  AFI Sum(cm)     %Tile       Largest Pocket(cm)  20.22           76          8.8  RUQ(cm)       RLQ(cm)       LUQ(cm)        LLQ(cm)  8.8           4.4           3.6            3.42 ---------------------------------------------------------------------- Biophysical Evaluation  Amniotic F.V:   Within normal limits       F. Tone:        Observed  F. Movement:    Observed                   Score:          8/8  F. Breathing:   Observed ---------------------------------------------------------------------- Biometry  BPD:      84.9  mm     G. Age:  34w 1d         78  %    CI:        71.66   %    70 - 86                                                          FL/HC:      19.7   %    19.9 - 21.5  HC:      319.3  mm     G. Age:  36w 0d         86  %    HC/AC:      0.96        0.96 - 1.11  AC:      332.4  mm     G. Age:  37w 1d       > 99  %    FL/BPD:     74.1   %    71 - 87  FL:       62.9  mm     G. Age:  32w 4d         26  %    FL/AC:      18.9   %    20 - 24  HUM:      55.4  mm     G. Age:  32w 2d         43  %  LV:        4.5  mm  Est. FW:    2709  gm           6 lb     98  % ---------------------------------------------------------------------- OB History  Blood Type:   A+  Gravidity:    5         Term:   2        Prem:   0        SAB:   2  TOP:          0       Ectopic:  0  Living: 2  ---------------------------------------------------------------------- Gestational Age  LMP:           34w 6d        Date:  02/01/20                 EDD:   11/07/20  U/S Today:     35w 0d                                        EDD:   11/06/20  Best:          33w 0d     Det. By:  U/S  (06/26/20)          EDD:   11/20/20 ---------------------------------------------------------------------- Cervix Uterus Adnexa  Cervix  Length:           3.55  cm.  Normal appearance by transabdominal scan.  Uterus  No abnormality visualized.  Right Ovary  Not visualized.  Left Ovary  Not visualized.  Adnexa  No abnormality visualized. ---------------------------------------------------------------------- Comments  This patient was seen for a follow up growth scan due to  recently diagnosed gestational diabetes.  The patient reports  that she may have to be placed on medication treatment for  her gestational diabetes soon.  She was informed that the fetal growth measures large for  her gestational age (98th percentile).  Borderline  polyhydramnios is noted today with an MVP of >8 cm.  However, the total AFI of 20.2 cm is within normal limits.  A biophysical profile performed today was 8 out of 8.  The implications and management of diabetes in pregnancy  was discussed in detail with the patient. She was advised that  our goals for her fingerstick values are fasting values of 90-95  or less and two-hour postprandials of 120 or less.  Should the  majority of her fingerstick values be above these values, she  may have to be started on insulin or metformin to help her  achieve better glycemic control. The patient was advised that  getting her fingerstick values as close to these goals as  possible would provide her with the most optimal obstetrical  outcome.  The patient was advised that obtaining optimal  glycemic control will hopefully prevent fetal macrosomia and  an indicated cesarean delivery.  We will continue to follow her closely  with weekly biophysical  profiles.  A biophysical profile scheduled in 1 week.  We will reassess  the fetal growth closer to delivery in 4 weeks. ----------------------------------------------------------------------                   Ma Rings, MD Electronically Signed Final Report   10/02/2020 12:33 pm ----------------------------------------------------------------------  [place] MFM OB FOLLOW UP  Result Date: 10/02/2020 ----------------------------------------------------------------------  OBSTETRICS REPORT                       (Signed Final 10/02/2020 12:33 pm) ---------------------------------------------------------------------- Patient Info  ID #:       [id number]                          D.O.B.:  1981-04-04 (39 yrs)  Name:       Casey Meyer                   Visit Date: 10/02/2020 10:50 am ---------------------------------------------------------------------- Performed By  Attending:  Ma Rings MD         Ref. Address:     [address]                                                             [address]  Performed By:     Hurman Horn          [facility]                    RD[facility]  Referred By:      [facility] ---------------------------------------------------------------------- Orders  #  Description                           Code        Ordered By  1  [place] MFM OB FOLLOW UP                   [id number]    Lin Landsman  2  [place] MFM FETAL BPP WO NON               [id number]    [facility]     STRESS ----------------------------------------------------------------------  #  Order #                     Accession #                Episode #  1  [id number]                   [id number]                 [id number]  2  [id number]                    [id number]                 [id number] ---------------------------------------------------------------------- Indications  Gestational diabetes in pregnancy, diet  O24.410  controlled  [redacted] weeks gestation of pregnancy                Z3A.70  Advanced maternal age multigravida 32+,        O72.523  third trimester ---------------------------------------------------------------------- Fetal Evaluation  Num Of Fetuses:         1  Fetal Heart Rate(bpm):  152  Cardiac Activity:       Observed  Presentation:           Cephalic  Placenta:               Anterior  Amniotic Fluid  AFI FV:      Within normal limits  AFI Sum(cm)     %Tile       Largest Pocket(cm)  20.22           76          8.8  RUQ(cm)       RLQ(cm)       LUQ(cm)        LLQ(cm)  8.8           4.4           3.6            3.42 ---------------------------------------------------------------------- Biophysical Evaluation  Amniotic F.V:   Within normal limits       F. Tone:        Observed  F. Movement:    Observed                   Score:          8/8  F. Breathing:   Observed ---------------------------------------------------------------------- Biometry  BPD:      84.9  mm     G. Age:  34w 1d         78  %    CI:        71.66   %    70 - 86                                                          FL/HC:      19.7   %    19.9 - 21.5  HC:      319.3  mm     G. Age:  36w 0d         86  %    HC/AC:      0.96        0.96 - 1.11  AC:      332.4  mm     G. Age:  37w 1d       > 99  %    FL/BPD:     74.1   %    71 - 87  FL:       62.9  mm     G. Age:  32w 4d         26  %    FL/AC:      18.9   %    20 - 24  HUM:      55.4  mm     G. Age:  32w 2d         43  %  LV:        4.5  mm  Est. FW:    2709  gm           6 lb     98  % ---------------------------------------------------------------------- OB History  Blood Type:   A+  Gravidity:    5         Term:   2        Prem:   0        SAB:   2  TOP:          0       Ectopic:  0        Living: 2  ---------------------------------------------------------------------- Gestational Age  LMP:           34w 6d        Date:  02/01/20                 EDD:   11/07/20  U/S Today:     35w 0d                                        EDD:   11/06/20  Best:          33w 0d     Det. By:  U/S  (06/26/20)          EDD:   11/20/20 ---------------------------------------------------------------------- Cervix Uterus Adnexa  Cervix  Length:           3.55  cm.  Normal appearance by transabdominal scan.  Uterus  No abnormality visualized.  Right Ovary  Not visualized.  Left Ovary  Not visualized.  Adnexa  No abnormality visualized. ---------------------------------------------------------------------- Comments  This patient was seen for a follow up growth scan due to  recently diagnosed gestational diabetes.  The patient reports  that she may have to be placed on medication treatment for  her gestational diabetes soon.  She was informed that the fetal growth measures large for  her gestational age (98th percentile).  Borderline  polyhydramnios is noted today with an MVP of >8 cm.  However, the total AFI of 20.2 cm is within normal limits.  A biophysical profile performed today was 8 out of 8.  The implications and management of diabetes in pregnancy  was discussed in detail with the patient. She was advised that  our goals for her fingerstick values are fasting values of 90-95  or less and two-hour postprandials of 120 or less.  Should the  majority of her fingerstick values be above these values, she  may have to be started on insulin or metformin to help her  achieve better glycemic control. The patient was advised that  getting her fingerstick values as close to these goals as  possible would provide her with the most optimal obstetrical  outcome.  The patient was advised that obtaining optimal  glycemic control will hopefully prevent fetal macrosomia and  an indicated cesarean delivery.  We will continue to follow her closely  with weekly biophysical  profiles.  A biophysical profile scheduled in 1 week.  We will reassess  the fetal growth closer to delivery in 4 weeks. ----------------------------------------------------------------------                   Ma RingsVictor Fang, MD Electronically Signed Final Report   10/02/2020 12:33 pm ----------------------------------------------------------------------   Assessment and Plan:  Pregnancy: [id number] at 9573w2d 1. Elevated blood pressure complicating pregnancy in third  trimester, antepartum 2. Hx of preeclampsia, prior pregnancy, currently pregnant, third trimester Patient was advised to come in to office or got to [facility] ED for further evaluation and labs, she reports that she feels better today and does not have transportation. Strict BP/preeclampsia precautions reviewed with patient. Discussed possibly needing early delivery if elevated BP persists.   3. Gestational diabetes mellitus (GDM) in third trimester controlled on oral hypoglycemic drug 4. Excessive fetal growth affecting management of pregnancy in third trimester, single or unspecified fetus Patient reports fasting in 90s, PP 110-120s. Continue Metformin management and antenatal testing/scans as per MFM  5. [redacted] weeks gestation of pregnancy 6. Supervision of high risk pregnancy, antepartum Office visit next week for pelvic cultures, further evaluation of BP and symptoms. Patient strongly encouraged to come in or go to MAU/ER for evaluation for any further symptoms. Preterm labor symptoms and general obstetric precautions including but not limited to vaginal bleeding, contractions, leaking of fluid and fetal movement were reviewed in detail with the patient. I discussed the assessment and treatment plan with the patient. The patient was provided an opportunity to ask questions and all were answered. The patient agreed with the plan and demonstrated an understanding of the instructions. The patient was advised to call  back or seek an in-person office evaluation/go to [facility]U at [facility] for any urgent or concerning symptoms. Please refer to After Visit Summary for other counseling recommendations.   I provided 12 minutes of face-to-face time during this encounter.  Return in about 1 week (around 10/25/2020) for Pelvic cultures, OFFICE OB VISIT (MD only).  Future Appointments  Date Time Provider Department Center  10/23/2020 10:30 AM [facility] NURSE [facility]  10/23/2020 10:45 AM [facility] US5 [facility]  10/25/2020  1:00 PM Reva Bores, MD [facility]  10/30/2020 10:00 AM [facility] NURSE [facility]  10/30/2020 10:30 AM [facility] US3 [facility] [place] [facility]  11/01/2020 11:45 AM Satrina Magallanes, Jethro Bastos, MD [facility]  11/08/2020 11:30 AM Mad[place] Bing, MD [facility]  11/22/2020 10:30 AM Morrison Bing, MD [facility]    Jaynie Collins, MD [facility], [facility]

## 2020-10-18 NOTE — Patient Instructions (Signed)
Hypertension During Pregnancy High blood pressure (hypertension) is when the force of blood pumping through the arteries is high enough to cause problems with your health. Arteries are blood vessels that carry blood from the heart throughout the body. Hypertension during pregnancy can cause problems for you and your baby. It can be mild or severe. There are different types of hypertension that can happen during pregnancy. These include:  Chronic hypertension. This happens when you had high blood pressure before you became pregnant, and it continues during the pregnancy. Hypertension that develops before you are [redacted] weeks pregnant and continues during the pregnancy is also called chronic hypertension. If you have chronic hypertension, it will not go away after you have your baby. You will need follow-up visits with your health care provider after you have your baby. Your health care provider may want you to keep taking medicine for your blood pressure.  Gestational hypertension. This is hypertension that develops after the 20th week of pregnancy. Gestational hypertension usually goes away after you have your baby, but your health care provider will need to monitor your blood pressure to make sure that it is getting better.  Postpartum hypertension. This is high blood pressure that was present before delivery and continues after delivery or that starts after delivery. This usually occurs within 48 hours after childbirth but may occur up to 6 weeks after giving birth. When hypertension during pregnancy is severe, it is a medical emergency that requires treatment right away. How does this affect me? Women who have hypertension during pregnancy have a greater chance of developing hypertension later in life or during future pregnancies. In some cases, hypertension during pregnancy can cause serious complications, such as:  Stroke.  Heart attack.  Injury to other organs, such as kidneys, lungs, or  liver.  Preeclampsia.  A condition called hemolysis, elevated liver enzymes, and low platelet count (HELLP) syndrome.  Convulsions or seizures.  Placental abruption. How does this affect my baby? Hypertension during pregnancy can affect your baby. Your baby may:  Be born early (prematurely).  Not weigh as much as he or she should at birth (low birth weight).  Not tolerate labor well, leading to an unplanned cesarean delivery. This condition may also result in a baby's death before birth (stillbirth). What are the risks? There are certain factors that make it more likely for you to develop hypertension during pregnancy. These include:  Having hypertension during a previous pregnancy or a family history of hypertension.  Being overweight.  Being age 35 or older.  Being pregnant for the first time.  Being pregnant with more than one baby.  Becoming pregnant using fertilization methods, such as IVF (in vitro fertilization).  Having other medical problems, such as diabetes, kidney disease, or lupus. What can I do to lower my risk? The exact cause of hypertension during pregnancy is not known. You may be able to lower your risk by:  Maintaining a healthy weight.  Eating a healthy and balanced diet.  Following your health care provider's instructions about treating any long-term conditions that you had before becoming pregnant. It is very important to keep all of your prenatal care appointments. Your health care provider will check your blood pressure and make sure that your pregnancy is progressing as expected. If a problem is found, early treatment can prevent complications.   How is this treated? Treatment for hypertension during pregnancy varies depending on the type of hypertension you have and how serious it is.  If you were   taking medicine for high blood pressure before you became pregnant, talk with your health care provider. You may need to change medicine during  pregnancy because some medicines, like ACE inhibitors, may not be considered safe for your baby.  If you have gestational hypertension, your health care provider may order medicine to treat this during pregnancy.  If you are at risk for preeclampsia, your health care provider may recommend that you take a low-dose aspirin during your pregnancy.  If you have severe hypertension, you may need to be hospitalized so you and your baby can be monitored closely. You may also need to be given medicine to lower your blood pressure.  In some cases, if your condition gets worse, you may need to deliver your baby early. Follow these instructions at home: Eating and drinking  Drink enough fluid to keep your urine pale yellow.  Avoid caffeine.   Lifestyle  Do not use any products that contain nicotine or tobacco. These products include cigarettes, chewing tobacco, and vaping devices, such as e-cigarettes. If you need help quitting, ask your health care provider.  Do not use alcohol or drugs.  Avoid stress as much as possible.  Rest and get plenty of sleep.  Regular exercise can help to reduce your blood pressure. Ask your health care provider what kinds of exercise are best for you. General instructions  Take over-the-counter and prescription medicines only as told by your health care provider.  Keep all prenatal and follow-up visits. This is important. Contact a health care provider if:  You have symptoms that your health care provider told you may require more treatment or monitoring, such as: ? Headaches. ? Nausea or vomiting. ? Abdominal pain. ? Dizziness. ? Light-headedness. Get help right away if:  You have symptoms of serious complications, such as: ? Severe abdominal pain that does not get better with treatment. ? A severe headache that does not get better, blurred vision, or double vision. ? Vomiting that does not get better. ? Sudden, rapid weight gain or swelling in your  hands, ankles, or face. ? Vaginal bleeding. ? Blood in your urine. ? Shortness of breath or chest pain. ? Weakness on one side of your body or difficulty speaking.  Your baby is not moving as much as usual. These symptoms may represent a serious problem that is an emergency. Do not wait to see if the symptoms will go away. Get medical help right away. Call your local emergency services (911 in the [place] Do not drive yourself to the hospital. Summary  Hypertension during pregnancy can cause problems for you and your baby.  Treatment for hypertension during pregnancy varies depending on the type of hypertension you have and how serious it is.  Keep all prenatal and follow-up visits. This is important.  Get help right away if you have symptoms of serious complications related to high blood pressure. This information is not intended to replace advice given to you by your health care provider. Make sure you discuss any questions you have with your health care provider. Document Revised: 03/29/2020 Document Reviewed: 03/29/2020 Elsevier Patient Education  2021 [employer]  

## 2020-10-19 ENCOUNTER — Encounter: Payer: Self-pay | Admitting: Obstetrics and Gynecology

## 2020-10-19 ENCOUNTER — Telehealth: Payer: Self-pay

## 2020-10-19 ENCOUNTER — Observation Stay
Admission: EM | Admit: 2020-10-19 | Discharge: 2020-10-19 | Disposition: A | Discharge status: Home / Self Care | Payer: Medicaid Other | Attending: Obstetrics and Gynecology | Admitting: Obstetrics and Gynecology

## 2020-10-19 ENCOUNTER — Other Ambulatory Visit: Payer: Self-pay

## 2020-10-19 DIAGNOSIS — O133 Gestational [pregnancy-induced] hypertension without significant proteinuria, third trimester: Principal | ICD-10-CM

## 2020-10-19 DIAGNOSIS — Z3A35 35 weeks gestation of pregnancy: Secondary | ICD-10-CM

## 2020-10-19 LAB — CBC WITH DIFFERENTIAL/PLATELET
Abs Immature Granulocytes: 0.09 10*3/uL — ABNORMAL HIGH (ref 0.00–0.07)
Basophils Absolute: 0 10*3/uL (ref 0.0–0.1)
Basophils Relative: 0 %
Eosinophils Absolute: 0.4 10*3/uL (ref 0.0–0.5)
Eosinophils Relative: 3 %
HCT: 30.1 % — ABNORMAL LOW (ref 36.0–46.0)
Hemoglobin: 10.4 g/dL — ABNORMAL LOW (ref 12.0–15.0)
Immature Granulocytes: 1 %
Lymphocytes Relative: 23 %
Lymphs Abs: 3.3 10*3/uL (ref 0.7–4.0)
MCH: 31.3 pg (ref 26.0–34.0)
MCHC: 34.6 g/dL (ref 30.0–36.0)
MCV: 90.7 fL (ref 80.0–100.0)
Monocytes Absolute: 1.1 10*3/uL — ABNORMAL HIGH (ref 0.1–1.0)
Monocytes Relative: 7 %
Neutro Abs: 9.7 10*3/uL — ABNORMAL HIGH (ref 1.7–7.7)
Neutrophils Relative %: 66 %
Platelets: 244 10*3/uL (ref 150–400)
RBC: 3.32 MIL/uL — ABNORMAL LOW (ref 3.87–5.11)
RDW: 14.7 % (ref 11.5–15.5)
WBC: 14.5 10*3/uL — ABNORMAL HIGH (ref 4.0–10.5)
nRBC: 0 % (ref 0.0–0.2)

## 2020-10-19 LAB — COMPREHENSIVE METABOLIC PANEL
ALT: 18 U/L (ref 0–44)
AST: 27 U/L (ref 15–41)
Albumin: 2.9 g/dL — ABNORMAL LOW (ref 3.5–5.0)
Alkaline Phosphatase: 62 U/L (ref 38–126)
Anion gap: 11 (ref 5–15)
BUN: <5 mg/dL — ABNORMAL LOW (ref 6–20)
CO2: 22 mmol/L (ref 22–32)
Calcium: 8.6 mg/dL — ABNORMAL LOW (ref 8.9–10.3)
Chloride: 107 mmol/L (ref 98–111)
Creatinine, Ser: 0.42 mg/dL — ABNORMAL LOW (ref 0.44–1.00)
GFR, Estimated: >60 mL/min (ref >60)
Glucose, Bld: 83 mg/dL (ref 70–99)
Potassium: 2.5 mmol/L — CL (ref 3.5–5.1)
Sodium: 140 mmol/L (ref 135–145)
Total Bilirubin: 0.6 mg/dL (ref 0.3–1.2)
Total Protein: 6.1 g/dL — ABNORMAL LOW (ref 6.5–8.1)

## 2020-10-19 LAB — PROTEIN / CREATININE RATIO, URINE
Creatinine, Urine: 71 mg/dL
Protein Creatinine Ratio: 0.31 mg/mg{Cre} — ABNORMAL HIGH (ref 0.00–0.15)
Total Protein, Urine: 22 mg/dL

## 2020-10-19 NOTE — Telephone Encounter (Signed)
Called to check in on pt per Dr. Macon Large from visit yesterday.  Pt states she no longer has a headache but did have several high blood pressures. (183/99, 170/96, 159/100) Advised pt to take bp on the phone, pt did and bp was 156/90.   Per Dr. Macon Large pt should report to [facility] to have labs drawn and to further evaluate. Pt states she has kids, and her boyfriend is at work. Pt states she will go to [facility].  Informed patient our providers are not at [facility] but we do both use Epic.

## 2020-10-19 NOTE — Discharge Instructions (Signed)
Please follow up with your provider on Monday and/or sooner if you have any further symptoms of PIH or concerns with baby.

## 2020-10-19 NOTE — OB Triage Note (Signed)
Patient arrived after coming from her OB appointment at [facility] after advise from her OB to be further evaluated at the hospital for elevated blood pressures. Patient intends to deliver at [facility], but came here because it is closer to her home. Patient reports fetal movement as normal, no leaking of fluid or vaginal bleeding.  Patient reports some peripheral lights in her vision today for the first time when her sister came to pick her up to come to hospital. She is not currently experiencing floaters. She says she has some upper right intermittent sharp pain, but is not consistent. She says that she thinks it's from where baby is kicking her ribs. Patient's serial blood pressures were taken and relayed to Dr. Logan Bores. Blood and urine labs ordered. See results. Dr. Logan Bores reviewed all results including critical value report of potassium at 2.5. Dr. Logan Bores also reviewed fetal heart rate strip and gave orders for discharge with instructions to follow up with her provider on Monday or sooner if warranted by signs and symptoms. Patient cautioned on S&S of preeclampsia, preterm labor precautions, and leaking of fluid and urged to call her doctor if any concerning signs. Patient discharged home with her sister.

## 2020-10-22 NOTE — Discharge Summary (Signed)
    L&D OB Triage Note  SUBJECTIVE Casey Meyer is a 40 y.o. [id number] female at [redacted]w[redacted]d, EDD Estimated Date of Delivery: 11/20/20 who presented to triage after speaking with her OB practice and being told to come to the hospital for increased BP's.  On admission, pt also c/o visual changes and intermittent RUQ pain.   OB History  Gravida Para Term Preterm AB Living  5 2 2  0 2 2  SAB IAB Ectopic Multiple Live Births  2 0 0 0 2    # Outcome Date GA Lbr Len/2nd Weight Sex Delivery Anes PTL Lv  5 Current           4 Term 02/13/12    M Vag-Spont   LIV  3 Term 08/19/09    F Vag-Spont   LIV  2 SAB 09/19/06          1 SAB 03/16/05            No medications prior to admission.     OBJECTIVE  Nursing Evaluation:   BP 137/77 (BP Location: Right Arm)   Pulse 90   Resp 16   LMP 02/01/2020 (Approximate)    Findings:        Moderate HTN     No evidence of pre-E with severe features (UPC ratio, LFTs, and platelets all normal.)      NST was performed and has been reviewed by me.  NST INTERPRETATION: Category I  Mode: External Baseline Rate (A): 125 bpm Variability: Moderate Accelerations: 15 x 15 Decelerations: None Nonstress Test Interpretation: Reactive Overall Impression: Reassuring for gestational age Contraction Frequency (min): irregular  ASSESSMENT Impression:  1.  Pregnancy:  02/03/2020 at [redacted]w[redacted]d , EDD Estimated Date of Delivery: 11/20/20 2.  Reassuring fetal and maternal status 3.  Moderate HTN - no pre-E with severe features.  PLAN 1. Reassurance given. 2. Discharge home with standard labor precautions given to return to L&D or call the office for problems. 3. Pt advised to contact their OB practice Monday morning.

## 2020-10-23 ENCOUNTER — Other Ambulatory Visit: Payer: Self-pay

## 2020-10-23 ENCOUNTER — Other Ambulatory Visit: Payer: Self-pay | Admitting: *Deleted

## 2020-10-23 ENCOUNTER — Encounter: Payer: Self-pay | Admitting: *Deleted

## 2020-10-23 ENCOUNTER — Ambulatory Visit: Payer: Medicaid Other | Attending: Obstetrics

## 2020-10-23 ENCOUNTER — Ambulatory Visit: Payer: Medicaid Other | Admitting: *Deleted

## 2020-10-23 DIAGNOSIS — O09523 Supervision of elderly multigravida, third trimester: Secondary | ICD-10-CM | POA: Diagnosis not present

## 2020-10-23 DIAGNOSIS — O0990 Supervision of high risk pregnancy, unspecified, unspecified trimester: Secondary | ICD-10-CM

## 2020-10-23 DIAGNOSIS — O24415 Gestational diabetes mellitus in pregnancy, controlled by oral hypoglycemic drugs: Secondary | ICD-10-CM

## 2020-10-23 DIAGNOSIS — Z3A36 36 weeks gestation of pregnancy: Secondary | ICD-10-CM | POA: Diagnosis not present

## 2020-10-23 DIAGNOSIS — O24419 Gestational diabetes mellitus in pregnancy, unspecified control: Secondary | ICD-10-CM | POA: Diagnosis present

## 2020-10-25 ENCOUNTER — Ambulatory Visit (INDEPENDENT_AMBULATORY_CARE_PROVIDER_SITE_OTHER): Payer: Medicaid Other | Admitting: Family Medicine

## 2020-10-25 ENCOUNTER — Other Ambulatory Visit: Payer: Self-pay

## 2020-10-25 ENCOUNTER — Telehealth: Payer: Self-pay

## 2020-10-25 ENCOUNTER — Other Ambulatory Visit (HOSPITAL_COMMUNITY)
Admission: RE | Admit: 2020-10-25 | Discharge: 2020-10-25 | Disposition: A | Discharge status: Home / Self Care | Payer: Medicaid Other | Source: Ambulatory Visit | Attending: Family Medicine | Admitting: Family Medicine

## 2020-10-25 VITALS — BP 134/84 | HR 90 | Wt 197.0 lb

## 2020-10-25 DIAGNOSIS — O3663X0 Maternal care for excessive fetal growth, third trimester, not applicable or unspecified: Secondary | ICD-10-CM

## 2020-10-25 DIAGNOSIS — O0990 Supervision of high risk pregnancy, unspecified, unspecified trimester: Secondary | ICD-10-CM | POA: Insufficient documentation

## 2020-10-25 DIAGNOSIS — O09523 Supervision of elderly multigravida, third trimester: Secondary | ICD-10-CM

## 2020-10-25 DIAGNOSIS — O24415 Gestational diabetes mellitus in pregnancy, controlled by oral hypoglycemic drugs: Secondary | ICD-10-CM

## 2020-10-25 NOTE — Telephone Encounter (Signed)
Mar/called patient to advise of 11/06/2020 appointment@830a -no ans mach/vm.

## 2020-10-25 NOTE — Patient Instructions (Signed)

## 2020-10-25 NOTE — Progress Notes (Signed)
   PRENATAL VISIT NOTE  Subjective:  Casey Meyer is a 40 y.o. [id number] at [redacted]w[redacted]d being seen today for ongoing prenatal care.  She is currently monitored for the following issues for this high-risk pregnancy and has Supervision of high risk pregnancy, antepartum; AMA (advanced maternal age) multigravida 35+; History of COVID-19; History of maternal cervical laceration, currently pregnant; Fetal abnormality (R CPC, LVEIF) on ultrasound; Anemia in pregnancy; GDM (gestational diabetes mellitus); LGA (large for gestational age) fetus affecting management of mother; and Hx of preeclampsia, prior pregnancy, currently pregnant, third trimester on their problem list.  Patient reports no complaints.  Contractions: Irritability. Vag. Bleeding: None.  Movement: Present. Denies leaking of fluid.   The following portions of the patient's history were reviewed and updated as appropriate: allergies, current medications, past family history, past medical history, past social history, past surgical history and problem list.   Objective:   Vitals:   10/25/20 1310  BP: 134/84  Pulse: 90  Weight: 197 lb (89.4 kg)    Fetal Status: Fetal Heart Rate (bpm): 152 Fundal Height: 37 cm Movement: Present  Presentation: Vertex  General:  Alert, oriented and cooperative. Patient is in no acute distress.  Skin: Skin is warm and dry. No rash noted.   Cardiovascular: Normal heart rate noted  Respiratory: Normal respiratory effort, no problems with respiration noted  Abdomen: Soft, gravid, appropriate for gestational age.  Pain/Pressure: Present     Pelvic: Cervical exam deferred Dilation: 1 Effacement (%): Thick Station: Ballotable  Extremities: Normal range of motion.     Mental Status: Normal mood and affect. Normal behavior. Normal judgment and thought content.   Assessment and Plan:  Pregnancy: [id number] at [redacted]w[redacted]d 1. Gestational diabetes mellitus (GDM) in third trimester controlled on oral hypoglycemic drug No  book FBS 89-low 90s 2 hour pp 110-120s--none > 124  2. Supervision of high risk pregnancy, antepartum Cultures today - Strep Gp B NAA - GC/Chlamydia probe amp ([place])not at [place]  3. Excessive fetal growth affecting management of pregnancy in third trimester, single or unspecified fetus 98% last time, concerned about needing RCS  4. Multigravida of advanced maternal age in third trimester Low risk NIPT  Term labor symptoms and general obstetric precautions including but not limited to vaginal bleeding, contractions, leaking of fluid and fetal movement were reviewed in detail with the patient. Please refer to After Visit Summary for other counseling recommendations.   Return in 1 week (on 11/01/2020).  Future Appointments  Date Time Provider Department Center  10/30/2020 10:00 AM [facility] N[facility]SE [facility]  10/30/2020 10:30 AM [facility] US3 [facility]  11/01/2020 11:45 AM Anyanwu, Jethro Bastos, MD [facility]  11/06/2020  8:30 AM [facility] NURSE [facility]  11/06/2020  8:45 AM [facility] US4 [facility]  11/08/2020 11:30 AM [place] Bing, MD [facility]  11/22/2020 10:30 AM [place] Bing, MD [facility]    Reva Bores, MD

## 2020-10-27 LAB — STREP GP B NAA: Strep Gp B NAA: NEGATIVE

## 2020-10-29 LAB — GC/CHLAMYDIA PROBE AMP (~~LOC~~) NOT AT ARMC
Chlamydia: NEGATIVE
Comment: NEGATIVE
Comment: NORMAL
Neisseria Gonorrhea: NEGATIVE

## 2020-10-30 ENCOUNTER — Ambulatory Visit: Payer: Medicaid Other | Attending: Obstetrics

## 2020-10-30 ENCOUNTER — Other Ambulatory Visit: Payer: Self-pay

## 2020-10-30 ENCOUNTER — Ambulatory Visit: Payer: Medicaid Other | Admitting: *Deleted

## 2020-10-30 ENCOUNTER — Encounter: Payer: Self-pay | Admitting: *Deleted

## 2020-10-30 DIAGNOSIS — O24415 Gestational diabetes mellitus in pregnancy, controlled by oral hypoglycemic drugs: Secondary | ICD-10-CM

## 2020-10-30 DIAGNOSIS — O09523 Supervision of elderly multigravida, third trimester: Secondary | ICD-10-CM

## 2020-10-30 DIAGNOSIS — O99353 Diseases of the nervous system complicating pregnancy, third trimester: Secondary | ICD-10-CM | POA: Diagnosis not present

## 2020-10-30 DIAGNOSIS — O24419 Gestational diabetes mellitus in pregnancy, unspecified control: Secondary | ICD-10-CM

## 2020-10-30 DIAGNOSIS — Z362 Encounter for other antenatal screening follow-up: Secondary | ICD-10-CM

## 2020-10-30 DIAGNOSIS — O99343 Other mental disorders complicating pregnancy, third trimester: Secondary | ICD-10-CM | POA: Diagnosis not present

## 2020-10-30 DIAGNOSIS — O0990 Supervision of high risk pregnancy, unspecified, unspecified trimester: Secondary | ICD-10-CM | POA: Insufficient documentation

## 2020-10-30 DIAGNOSIS — Z3A37 37 weeks gestation of pregnancy: Secondary | ICD-10-CM

## 2020-10-30 DIAGNOSIS — O09293 Supervision of pregnancy with other poor reproductive or obstetric history, third trimester: Secondary | ICD-10-CM

## 2020-10-30 DIAGNOSIS — O359XX0 Maternal care for (suspected) fetal abnormality and damage, unspecified, not applicable or unspecified: Secondary | ICD-10-CM

## 2020-11-01 ENCOUNTER — Encounter: Payer: Self-pay | Admitting: Obstetrics & Gynecology

## 2020-11-01 ENCOUNTER — Other Ambulatory Visit: Payer: Self-pay

## 2020-11-01 ENCOUNTER — Encounter (HOSPITAL_COMMUNITY): Payer: Self-pay | Admitting: Obstetrics and Gynecology

## 2020-11-01 ENCOUNTER — Telehealth (INDEPENDENT_AMBULATORY_CARE_PROVIDER_SITE_OTHER): Payer: Medicaid Other | Admitting: Obstetrics & Gynecology

## 2020-11-01 ENCOUNTER — Inpatient Hospital Stay (HOSPITAL_COMMUNITY)
Admission: AD | Admit: 2020-11-01 | Discharge: 2020-11-06 | DRG: 783 | Disposition: A | Discharge status: Home / Self Care | Payer: Medicaid Other | Attending: Obstetrics & Gynecology | Admitting: Obstetrics & Gynecology

## 2020-11-01 VITALS — BP 158/85 | HR 86

## 2020-11-01 DIAGNOSIS — O1490 Unspecified pre-eclampsia, unspecified trimester: Secondary | ICD-10-CM | POA: Insufficient documentation

## 2020-11-01 DIAGNOSIS — O09523 Supervision of elderly multigravida, third trimester: Secondary | ICD-10-CM

## 2020-11-01 DIAGNOSIS — Z8616 Personal history of COVID-19: Secondary | ICD-10-CM | POA: Diagnosis not present

## 2020-11-01 DIAGNOSIS — O411230 Chorioamnionitis, third trimester, not applicable or unspecified: Secondary | ICD-10-CM | POA: Diagnosis present

## 2020-11-01 DIAGNOSIS — O24419 Gestational diabetes mellitus in pregnancy, unspecified control: Secondary | ICD-10-CM | POA: Diagnosis present

## 2020-11-01 DIAGNOSIS — D649 Anemia, unspecified: Secondary | ICD-10-CM

## 2020-11-01 DIAGNOSIS — O133 Gestational [pregnancy-induced] hypertension without significant proteinuria, third trimester: Secondary | ICD-10-CM

## 2020-11-01 DIAGNOSIS — O358XX0 Maternal care for other (suspected) fetal abnormality and damage, not applicable or unspecified: Secondary | ICD-10-CM | POA: Diagnosis present

## 2020-11-01 DIAGNOSIS — O99284 Endocrine, nutritional and metabolic diseases complicating childbirth: Secondary | ICD-10-CM | POA: Diagnosis present

## 2020-11-01 DIAGNOSIS — O99019 Anemia complicating pregnancy, unspecified trimester: Secondary | ICD-10-CM | POA: Diagnosis present

## 2020-11-01 DIAGNOSIS — O1404 Mild to moderate pre-eclampsia, complicating childbirth: Secondary | ICD-10-CM | POA: Diagnosis present

## 2020-11-01 DIAGNOSIS — O0990 Supervision of high risk pregnancy, unspecified, unspecified trimester: Secondary | ICD-10-CM

## 2020-11-01 DIAGNOSIS — O1493 Unspecified pre-eclampsia, third trimester: Secondary | ICD-10-CM

## 2020-11-01 DIAGNOSIS — Z3A37 37 weeks gestation of pregnancy: Secondary | ICD-10-CM | POA: Diagnosis not present

## 2020-11-01 DIAGNOSIS — O3660X0 Maternal care for excessive fetal growth, unspecified trimester, not applicable or unspecified: Secondary | ICD-10-CM | POA: Diagnosis present

## 2020-11-01 DIAGNOSIS — O359XX0 Maternal care for (suspected) fetal abnormality and damage, unspecified, not applicable or unspecified: Secondary | ICD-10-CM | POA: Diagnosis present

## 2020-11-01 DIAGNOSIS — O09293 Supervision of pregnancy with other poor reproductive or obstetric history, third trimester: Secondary | ICD-10-CM

## 2020-11-01 DIAGNOSIS — O99013 Anemia complicating pregnancy, third trimester: Secondary | ICD-10-CM

## 2020-11-01 DIAGNOSIS — O3663X0 Maternal care for excessive fetal growth, third trimester, not applicable or unspecified: Secondary | ICD-10-CM | POA: Diagnosis present

## 2020-11-01 DIAGNOSIS — E876 Hypokalemia: Secondary | ICD-10-CM | POA: Diagnosis present

## 2020-11-01 DIAGNOSIS — O09529 Supervision of elderly multigravida, unspecified trimester: Secondary | ICD-10-CM

## 2020-11-01 DIAGNOSIS — O24425 Gestational diabetes mellitus in childbirth, controlled by oral hypoglycemic drugs: Secondary | ICD-10-CM | POA: Diagnosis present

## 2020-11-01 DIAGNOSIS — O09299 Supervision of pregnancy with other poor reproductive or obstetric history, unspecified trimester: Secondary | ICD-10-CM

## 2020-11-01 DIAGNOSIS — Z87891 Personal history of nicotine dependence: Secondary | ICD-10-CM

## 2020-11-01 DIAGNOSIS — Z20822 Contact with and (suspected) exposure to covid-19: Secondary | ICD-10-CM | POA: Diagnosis present

## 2020-11-01 DIAGNOSIS — O24415 Gestational diabetes mellitus in pregnancy, controlled by oral hypoglycemic drugs: Secondary | ICD-10-CM

## 2020-11-01 DIAGNOSIS — Z302 Encounter for sterilization: Secondary | ICD-10-CM | POA: Diagnosis not present

## 2020-11-01 DIAGNOSIS — Z98891 History of uterine scar from previous surgery: Secondary | ICD-10-CM

## 2020-11-01 DIAGNOSIS — O134 Gestational [pregnancy-induced] hypertension without significant proteinuria, complicating childbirth: Principal | ICD-10-CM | POA: Diagnosis present

## 2020-11-01 HISTORY — DX: Anemia, unspecified: D64.9

## 2020-11-01 HISTORY — DX: Unspecified pre-eclampsia, unspecified trimester: O14.90

## 2020-11-01 LAB — RESP PANEL BY RT-PCR (FLU A&B, COVID) ARPGX2
Influenza A by PCR: NEGATIVE
Influenza B by PCR: NEGATIVE
SARS Coronavirus 2 by RT PCR: NEGATIVE

## 2020-11-01 LAB — CBC
HCT: 31.5 % — ABNORMAL LOW (ref 36.0–46.0)
Hemoglobin: 10.9 g/dL — ABNORMAL LOW (ref 12.0–15.0)
MCH: 31.9 pg (ref 26.0–34.0)
MCHC: 34.6 g/dL (ref 30.0–36.0)
MCV: 92.1 fL (ref 80.0–100.0)
Platelets: 255 10*3/uL (ref 150–400)
RBC: 3.42 MIL/uL — ABNORMAL LOW (ref 3.87–5.11)
RDW: 14.9 % (ref 11.5–15.5)
WBC: 14.2 10*3/uL — ABNORMAL HIGH (ref 4.0–10.5)
nRBC: 0 % (ref 0.0–0.2)

## 2020-11-01 LAB — TYPE AND SCREEN
ABO/RH(D): A POS
Antibody Screen: NEGATIVE

## 2020-11-01 LAB — GLUCOSE, CAPILLARY
Glucose-Capillary: 78 mg/dL (ref 70–99)
Glucose-Capillary: 86 mg/dL (ref 70–99)
Glucose-Capillary: 96 mg/dL (ref 70–99)

## 2020-11-01 LAB — COMPREHENSIVE METABOLIC PANEL
ALT: 18 U/L (ref 0–44)
AST: 19 U/L (ref 15–41)
Albumin: 2.8 g/dL — ABNORMAL LOW (ref 3.5–5.0)
Alkaline Phosphatase: 65 U/L (ref 38–126)
Anion gap: 10 (ref 5–15)
BUN: <5 mg/dL — ABNORMAL LOW (ref 6–20)
CO2: 24 mmol/L (ref 22–32)
Calcium: 8.6 mg/dL — ABNORMAL LOW (ref 8.9–10.3)
Chloride: 105 mmol/L (ref 98–111)
Creatinine, Ser: 0.44 mg/dL (ref 0.44–1.00)
GFR, Estimated: >60 mL/min (ref >60)
Glucose, Bld: 88 mg/dL (ref 70–99)
Potassium: 2.4 mmol/L — CL (ref 3.5–5.1)
Sodium: 139 mmol/L (ref 135–145)
Total Bilirubin: 0.7 mg/dL (ref 0.3–1.2)
Total Protein: 5.8 g/dL — ABNORMAL LOW (ref 6.5–8.1)

## 2020-11-01 LAB — PROTEIN / CREATININE RATIO, URINE
Creatinine, Urine: 141.9 mg/dL
Protein Creatinine Ratio: 0.42 mg/mg{Cre} — ABNORMAL HIGH (ref 0.00–0.15)
Total Protein, Urine: 59 mg/dL

## 2020-11-01 LAB — MAGNESIUM: Magnesium: 1.3 mg/dL — ABNORMAL LOW (ref 1.7–2.4)

## 2020-11-01 MED ORDER — LACTATED RINGERS IV SOLN: 500.0000 mL | INTRAVENOUS | Status: DC | PRN

## 2020-11-01 MED ORDER — HYDROXYZINE HCL 50 MG PO TABS: 50.0000 mg | ORAL_TABLET | Freq: Four times a day (QID) | ORAL | Status: DC | PRN

## 2020-11-01 MED ORDER — FENTANYL CITRATE (PF) 100 MCG/2ML IJ SOLN: 50.0000 ug | INTRAMUSCULAR | Status: DC | PRN

## 2020-11-01 MED ORDER — SOD CITRATE-CITRIC ACID 500-334 MG/5ML PO SOLN
30.0000 mL | ORAL | Status: DC | PRN
  Administered 2020-11-02 – 2020-11-03 (×2): 30 mL via ORAL
  Filled 2020-11-01 (×2): qty 15

## 2020-11-01 MED ORDER — LACTATED RINGERS IV SOLN: INTRAVENOUS | Status: DC

## 2020-11-01 MED ORDER — MISOPROSTOL 50MCG HALF TABLET
50.0000 ug | ORAL_TABLET | ORAL | Status: DC | PRN
  Administered 2020-11-01 (×2): 50 ug via BUCCAL
  Filled 2020-11-01: qty 1

## 2020-11-01 MED ORDER — POTASSIUM CHLORIDE 20 MEQ PO PACK
40.0000 meq | PACK | ORAL | Status: DC
  Administered 2020-11-01: 40 meq via ORAL
  Filled 2020-11-01 (×4): qty 2

## 2020-11-01 MED ORDER — ONDANSETRON HCL 4 MG/2ML IJ SOLN: 4.0000 mg | Freq: Four times a day (QID) | INTRAMUSCULAR | Status: DC | PRN

## 2020-11-01 MED ORDER — OXYCODONE-ACETAMINOPHEN 5-325 MG PO TABS: 1.0000 | ORAL_TABLET | ORAL | Status: DC | PRN

## 2020-11-01 MED ORDER — OXYTOCIN-SODIUM CHLORIDE 30-0.9 UT/500ML-% IV SOLN: 2.5000 [IU]/h | INTRAVENOUS | Status: DC

## 2020-11-01 MED ORDER — MISOPROSTOL 25 MCG QUARTER TABLET
25.0000 ug | ORAL_TABLET | ORAL | Status: DC | PRN
  Filled 2020-11-01: qty 1

## 2020-11-01 MED ORDER — LIDOCAINE HCL (PF) 1 % IJ SOLN: 30.0000 mL | INTRAMUSCULAR | Status: DC | PRN

## 2020-11-01 MED ORDER — TERBUTALINE SULFATE 1 MG/ML IJ SOLN: 0.2500 mg | Freq: Once | INTRAMUSCULAR | Status: DC | PRN

## 2020-11-01 MED ORDER — OXYTOCIN-SODIUM CHLORIDE 30-0.9 UT/500ML-% IV SOLN
1.0000 m[IU]/min | INTRAVENOUS | Status: DC
Start: 2020-11-01 — End: 2020-11-03
  Administered 2020-11-02: 2 m[IU]/min via INTRAVENOUS
  Filled 2020-11-01 (×2): qty 500

## 2020-11-01 MED ORDER — POTASSIUM CHLORIDE 10 MEQ/100ML IV SOLN
10.0000 meq | INTRAVENOUS | Status: AC
  Administered 2020-11-01 (×2): 10 meq via INTRAVENOUS
  Filled 2020-11-01 (×5): qty 100

## 2020-11-01 MED ORDER — FLEET ENEMA 7-19 GM/118ML RE ENEM: 1.0000 | ENEMA | Freq: Every day | RECTAL | Status: DC | PRN

## 2020-11-01 MED ORDER — MISOPROSTOL 50MCG HALF TABLET
ORAL_TABLET | ORAL | Status: AC
  Filled 2020-11-01: qty 1

## 2020-11-01 MED ORDER — OXYTOCIN BOLUS FROM INFUSION: 333.0000 mL | Freq: Once | INTRAVENOUS | Status: DC

## 2020-11-01 MED ORDER — ACETAMINOPHEN 325 MG PO TABS
650.0000 mg | ORAL_TABLET | ORAL | Status: DC | PRN
  Administered 2020-11-02: 650 mg via ORAL
  Filled 2020-11-01 (×2): qty 2

## 2020-11-01 MED ORDER — ZOLPIDEM TARTRATE 5 MG PO TABS: 5.0000 mg | ORAL_TABLET | Freq: Every evening | ORAL | Status: DC | PRN

## 2020-11-01 MED ORDER — MAGNESIUM SULFATE 2 GM/50ML IV SOLN
2.0000 g | Freq: Once | INTRAVENOUS | Status: AC
  Administered 2020-11-01: 2 g via INTRAVENOUS
  Filled 2020-11-01 (×2): qty 50

## 2020-11-01 MED ORDER — OXYCODONE-ACETAMINOPHEN 5-325 MG PO TABS: 2.0000 | ORAL_TABLET | ORAL | Status: DC | PRN

## 2020-11-01 NOTE — Progress Notes (Signed)
OBSTETRICS PRENATAL VIRTUAL VISIT ENCOUNTER NOTE  Provider location: [facility]   Patient location: Home  I connected with Casey Meyer on 11/01/20 at  8:00 AM EDT by MyChart Video Encounter and verified that I am speaking with the correct person using two identifiers. I discussed the limitations, risks, security and privacy concerns of performing an evaluation and management service virtually and the availability of in person appointments. I also discussed with the patient that there may be a patient responsible charge related to this service. The patient expressed understanding and agreed to proceed. Subjective:  Casey Meyer is a 40 y.o. [id number] at [redacted]w[redacted]d being seen today for ongoing prenatal care.  She is currently monitored for the following issues for this high-risk pregnancy and has Supervision of high risk pregnancy, antepartum; AMA (advanced maternal age) multigravida 35+; History of COVID-19; History of maternal cervical laceration, currently pregnant; Fetal abnormality (R CPC, LVEIF) on ultrasound; Anemia in pregnancy; GDM (gestational diabetes mellitus); LGA (large for gestational age) fetus affecting management of mother; Hx of preeclampsia, prior pregnancy, currently pregnant, third trimester; and Gestational hypertension, third trimester on their problem list.  Patient reports ongoing mild headache. No current visual changes, but had 'halo effect' on vision yesterday.  Endorses right sided pain under ribs, but no epigastric pain. Some nausea, no emesis. Reports swelling in legs, especially her left leg. Feels overall bad, unable to eat.  Contractions: Irregular. Vag. Bleeding: None.  Movement: Present. Denies any leaking of fluid.   The following portions of the patient's history were reviewed and updated as appropriate: allergies, current medications, past family history, past medical history, past social history, past surgical history and problem  list.   Objective:   Vitals:   11/01/20 0829  BP: (!) 158/85  Pulse: 86  4/12 BP 148/77 at 37w   3/29 149/77 at 35w  Fetal Status:     Movement: Present     General:  Alert, oriented and cooperative. Patient is in no acute distress.  Respiratory: Normal respiratory effort, no problems with respiration noted  Mental Status: Normal mood and affect. Normal behavior. Normal judgment and thought content.  Rest of physical exam deferred due to type of encounter  Imaging: [place] MFM FETAL BPP WO NON STRESS  Result Date: 10/30/2020 ----------------------------------------------------------------------  OBSTETRICS REPORT                       (Signed Final 10/30/2020 12:04 pm) ---------------------------------------------------------------------- Patient Info  ID #:       [id number]                          D.O.B.:  12/24/1980 (39 yrs)  Name:       Casey Meyer                   Visit Date: 10/30/2020 10:48 am ---------------------------------------------------------------------- Performed By  Attending:        Ma Rings MD         Ref. Address:     [address]                                                             [address]  Performed By:  Mesha Tester BS,       [facility]                                [facility]  Referred By:      [facility] ---------------------------------------------------------------------- Orders  #  Description                           Code        Ordered By  1  [place] MFM FETAL BPP WO NON               76819.01    YU FANG     STRESS  2  [place] MFM OB FOLLOW UP                   [id number]    YU FANG ----------------------------------------------------------------------  #  Order #                     Accession #                Episode #  1  [id number]                   [id number]                  [id number]  2  [id number]                   [id number]                 [id number] ---------------------------------------------------------------------- Indications  Gestational diabetes in pregnancy,             O24.415  controlled by oral hypoglycemic drugs  Advanced maternal age multigravida 34+,        O92.523  third trimester  [redacted] weeks gestation of pregnancy                Z3A.37  Fetal abnormality - other known or             O35.9XX0  suspected (ICEF, CP cyst)  Other mental disorder complicating             O99.340  pregnancy, unspecified trimester  Poor obstetric history: Previous               O09.299  preeclampsia / eclampsia/gestational HTN  Encounter for other antenatal screening        Z36.2  follow-up ---------------------------------------------------------------------- Fetal Evaluation  Num Of Fetuses:         1  Fetal Heart Rate(bpm):  126  Cardiac Activity:       Observed  Presentation:           Cephalic  Placenta:               Posterior  P. Cord Insertion:      Previously Visualized  Amniotic Fluid  AFI FV:      Within normal limits  AFI Sum(cm)     %Tile       Largest Pocket(cm)  21.8            84          6.4  RUQ(cm)       RLQ(cm)       LUQ(cm)        LLQ(cm)  6.4           6             4.7            4.7 ---------------------------------------------------------------------- Biophysical Evaluation  Amniotic F.V:   Pocket => 2 cm             F. Tone:        Observed  F. Movement:    Observed                   Score:          8/8  F. Breathing:   Observed ---------------------------------------------------------------------- Biometry  BPD:      90.2  mm     G. Age:  36w 4d         53  %    CI:        74.58   %    70 - 86                                                          FL/HC:      21.1   %    20.8 - 22.6  HC:      331.5  mm     G. Age:  37w 6d         43  %    HC/AC:      0.92        0.92 - 1.05  AC:      358.7  mm     G. Age:  39w 5d       > 99  %    FL/BPD:     77.6   %     71 - 87  FL:         70  mm     G. Age:  35w 6d         22  %    FL/AC:      19.5   %    20 - 24  LV:        6.2  mm  Est. FW:    3458  gm    7 lb 10 oz      87  % ---------------------------------------------------------------------- OB History  Blood Type:   A+  Gravidity:    5         Term:   2  Prem:   0        SAB:   2  TOP:          0       Ectopic:  0        Living: 2 ---------------------------------------------------------------------- Gestational Age  LMP:           38w 6d        Date:  02/01/20                 EDD:   11/07/20  U/S Today:     37w 4d                                        EDD:   11/16/20  Best:          37w 0d     Det. By:  U/S  (06/26/20)          EDD:   11/20/20 ---------------------------------------------------------------------- Anatomy  Cranium:               Previously seen        LVOT:                   Previously seen  Cavum:                 Previously seen        Aortic Arch:            Previously seen  Ventricles:            Appears normal         Ductal Arch:            Previously seen  Choroid Plexus:        Previously seen        Diaphragm:              Appears normal  Cerebellum:            Previously seen        Stomach:                Appears normal, left                                                                        sided  Posterior Fossa:       Previously seen        Abdomen:                Previously seen  Nuchal Fold:           Not applicable (>20    Abdominal Wall:         Previously seen                         wks GA)  Face:                  Orbits and profile     Cord Vessels:           Previously seen  previously seen  Lips:                  Previously seen        Kidneys:                Appear normal  Palate:                Previously seen        Bladder:                Appears normal  Thoracic:              Previously seen        Spine:                  Previously seen  Heart:                 Appears normal; EIF    Upper  Extremities:      Previously seen  RVOT:                  Previously seen        Lower Extremities:      Previously seen  Other:  Female gender previously seen. VC, 3VV and 3VTV visualized          previously. Technically difficult due to advanced GA and fetal position. ---------------------------------------------------------------------- Cervix Uterus Adnexa  Cervix  Normal appearance by transabdominal scan.  Uterus  No abnormality visualized.  Right Ovary  Within normal limits.  Left Ovary  Not visualized. No adnexal mass visualized.  Cul De Sac  No free fluid seen.  Adnexa  No abnormality visualized. ---------------------------------------------------------------------- Comments  This patient was seen for a follow up growth scan due to due  to gestational diabetes that is currently treated with  Metformin.  She denies any problems since her last exam.  She was informed that the fetal growth and amniotic fluid  level appears appropriate for her gestational age.  A biophysical profile performed today was 8 out of 8.  She will return in 1 week for another biophysical profile.  Due to gestational diabetes that is currently treated with  Metformin, delivery is recommended at around 39 weeks.  Based on today's ultrasound, the estimated fetal weight at  around 39 weeks will most likely be between 8 to 8-1/2  pounds.  This weight will be about 1 pound larger than her  prior child. ----------------------------------------------------------------------                   Ma Rings, MD Electronically Signed Final Report   10/30/2020 12:04 pm ----------------------------------------------------------------------  [place] MFM FETAL BPP WO NON STRESS  Result Date: 10/23/2020 ----------------------------------------------------------------------  OBSTETRICS REPORT                       (Signed Final 10/23/2020 11:06 am) ---------------------------------------------------------------------- Patient Info  ID #:       [id number]                           D.O.B.:  December 29, 1980 (39 yrs)  Name:       Casey Meyer                   Visit Date: 10/23/2020 10:24 am ---------------------------------------------------------------------- Performed By  Attending:        Lin Landsman      Ref. Address:     [address]                                                             [address]  Performed By:     Sandi Mealy        [facility]                                     [facility]  Referred By:      [facility] ---------------------------------------------------------------------- Orders  #  Description                           Code        Ordered By  1  [place] MFM FETAL BPP WO NON               [phone]    YU FANG     STRESS ----------------------------------------------------------------------  #  Order #                     Accession #                Episode #  1  [id number]                   [id number]                 [id number] ---------------------------------------------------------------------- Indications  Gestational diabetes in pregnancy,             O24.415  controlled by oral hypoglycemic drugs  Advanced maternal age multigravida 2+,        O89.523  third trimester  [redacted] weeks gestation of pregnancy                Z3A.36 ---------------------------------------------------------------------- Fetal Evaluation  Num Of Fetuses:         1  Fetal Heart Rate(bpm):  136  Cardiac Activity:       Observed  Presentation:           Cephalic  Placenta:               Posterior  P. Cord Insertion:  Visualized  Amniotic Fluid  AFI FV:      Within normal limits  AFI Sum(cm)     %Tile       Largest Pocket(cm)  16.58           61          5.61  RUQ(cm)       RLQ(cm)       LUQ(cm)        LLQ(cm)  4.25          5.61          3.3             3.42 ---------------------------------------------------------------------- Biophysical Evaluation  Amniotic F.V:   Pocket => 2 cm             F. Tone:        Observed  F. Movement:    Observed                   Score:          8/8  F. Breathing:   Observed ---------------------------------------------------------------------- OB History  Blood Type:   A+  Gravidity:    5         Term:   2        Prem:   0        SAB:   2  TOP:          0       Ectopic:  0        Living: 2 ---------------------------------------------------------------------- Gestational Age  LMP:           37w 6d        Date:  02/01/20                 EDD:   11/07/20  Best:          Stevie Kern 0d     Det. By:  U/S  (06/26/20)          EDD:   11/20/20 ---------------------------------------------------------------------- Impression  Antenatal testing performed given maternal A2GDM  The biophysical profile was 8/8 with good fetal movement and  amniotic fluid volume.  Her blood sugars are reported to be with in normal range  >90% on prior prenatal visit 03/31/ ---------------------------------------------------------------------- Recommendations  Repeat growth previously scheduled in 1 week. ----------------------------------------------------------------------               Lin Landsman, MD Electronically Signed Final Report   10/23/2020 11:06 am ----------------------------------------------------------------------  [place] MFM FETAL BPP WO NON STRESS  Result Date: 10/16/2020 ----------------------------------------------------------------------  OBSTETRICS REPORT                       (Signed Final 10/16/2020 05:23 pm) ---------------------------------------------------------------------- Patient Info  ID #:       [id number]                          D.O.B.:  04-29-1981 (39 yrs)  Name:       Casey Meyer                   Visit Date: 10/16/2020 03:25 pm ---------------------------------------------------------------------- Performed By  Attending:         Ma Rings MD         Ref. Address:     [address]  Performed By:     Sandi MealyJovancia Adrien        [facility]                    RD[facility]                                     [facility]  Referred By[facility]      [facility] ---------------------------------------------------------------------- Orders  #  Description                           Code        Ordered By  1  US MFM FETAL BPP WO NON               [phone]) [id number]    YU FANG     STRESS ----------------------------------------------------------------------  #  Order #                     Accession #                Episode #  1  [id number]                   [id number]                 [id number] ---------------------------------------------------------------------- Indications  Gestational diabetes in pregnancy,             O24.415  controlled by oral hypoglycemic drugs  Advanced maternal age multigravida 2735+,        38O09.523  third trimester  [redacted] weeks gestation of pregnancy                Z3A.35 ---------------------------------------------------------------------- Fetal Evaluation  Num Of Fetuses:         1  Fetal Heart Rate(bpm):  122  Cardiac Activity:       Observed  Amniotic Fluid  AFI FV:      Within normal limits  AFI Sum(cm)     %Tile       Largest Pocket(cm)  20.81           78          6.03  RUQ(cm)       RLQ(cm)       LUQ(cm)        LLQ(cm)  6.03          4.87          5.49           4.42 ---------------------------------------------------------------------- Biophysical Evaluation  Amniotic F.V:   Pocket =>  2 cm             F. Tone:        Observed  F. Movement:    Observed                   Score:          8/8  F. Breathing:   Observed ---------------------------------------------------------------------- OB History  Blood Type:   A+   Gravidity:    5         Term:   2        Prem:   0        SAB:   2  TOP:          0       Ectopic:  0        Living: 2 ---------------------------------------------------------------------- Gestational Age  LMP:           36w 6d        Date:  02/01/20                 EDD:   11/07/20  Best:          Consuello Closs 0d     Det. By:  U/S  (06/26/20)          EDD:   11/20/20 ---------------------------------------------------------------------- Comments  This patient was seen for a biophysical profile due to  gestational diabetes that is currently treated with Metformin.  She denies any problems since her last exam.  A biophysical profile performed today was 8 out of 8.  There was normal amniotic fluid noted on today's ultrasound  exam.  She will return in 1 week for another biophysical profile. ----------------------------------------------------------------------                   Ma Rings, MD Electronically Signed Final Report   10/16/2020 05:23 pm ----------------------------------------------------------------------  [place] MFM FETAL BPP WO NON STRESS  Result Date: 10/08/2020 ----------------------------------------------------------------------  OBSTETRICS REPORT                       (Signed Final 10/08/2020 04:09 pm) ---------------------------------------------------------------------- Patient Info  ID #:       [id number]                          D.O.B.:  1980/10/11 (39 yrs)  Name:       Casey Meyer                   Visit Date: 10/08/2020 03:30 pm ---------------------------------------------------------------------- Performed By  Attending:        Noralee Space MD        Ref. Address:     [address]                                                             [address]  Performed By:     Fayne Norrie BS,      [facility] RVT  [facility]  Referred By:      [facility] ---------------------------------------------------------------------- Orders  #  Description                           Code        Ordered By  1  [place] MFM FETAL BPP WO NON               [phone]    YU FANG     STRESS ----------------------------------------------------------------------  #  Order #                     Accession #                Episode #  1  [id number]                   [id number]                 [id number] ---------------------------------------------------------------------- Indications  Gestational diabetes in pregnancy,             O24.419  unspecified control  Advanced maternal age multigravida 74+,        O59.523  third trimester  101 weeks gestation of pregnancy                Z3A.33 ---------------------------------------------------------------------- Fetal Evaluation  Num Of Fetuses:         1  Fetal Heart Rate(bpm):  132  Cardiac Activity:       Observed  Presentation:           Cephalic  Placenta:               Posterior  P. Cord Insertion:      Previously Visualized  Amniotic Fluid  AFI FV:      Within normal limits  AFI Sum(cm)     %Tile       Largest Pocket(cm)  20.6            77          6.79  RUQ(cm)       RLQ(cm)       LUQ(cm)        LLQ(cm)  5.29          3.02          5.5            6.79 ---------------------------------------------------------------------- Biophysical Evaluation  Amniotic F.V:   Pocket => 2 cm             F. Tone:        Observed  F. Movement:    Observed  Score:          8/8  F. Breathing:   Observed ---------------------------------------------------------------------- Biometry  LV:        4.4  mm ---------------------------------------------------------------------- OB History  Blood Type:   A+  Gravidity:    5         Term:   2        Prem:   0        SAB:   2  TOP:          0       Ectopic:  0        Living: 2  ---------------------------------------------------------------------- Gestational Age  LMP:           35w 5d        Date:  02/01/20                 EDD:   11/07/20  Best:          33w 6d     Det. By:  U/S  (06/26/20)          EDD:   11/20/20 ---------------------------------------------------------------------- Anatomy  Lips:                  Appears normal         Kidneys:                Appear normal  Stomach:               Appears normal, left   Bladder:                Appears normal                         sided  Cord Vessels:          Appears normal (3                         vessel cord) ---------------------------------------------------------------------- Impression  Gestational diabetes. Reportedly not well controlled on diet.  Patient is considering insulin as advised by her physician.  Amniotic fluid is normal and good fetal activity is seen  .Antenatal testing is reassuring. BPP 8/8. ---------------------------------------------------------------------- Recommendations  -Continue weekly BPP till delivery. ----------------------------------------------------------------------                  Noralee Space, MD Electronically Signed Final Report   10/08/2020 04:09 pm ----------------------------------------------------------------------  [place] MFM FETAL BPP WO NON STRESS  Result Date: 10/02/2020 ----------------------------------------------------------------------  OBSTETRICS REPORT                       (Signed Final 10/02/2020 12:33 pm) ---------------------------------------------------------------------- Patient Info  ID #:       [id number]                          D.O.B.:  1980/10/30 (39 yrs)  Name:       Casey Meyer                   Visit Date: 10/02/2020 10:50 am ---------------------------------------------------------------------- Performed By  Attending:        Ma Rings MD         Ref. Address:     [address]  Performed  By:     Hurman Horn          [facility]                    RD[facility]                                     [facility]  Referred By:      [facility] ---------------------------------------------------------------------- Orders  #  Description                           Code        Ordered By  1  [place] MFM OB FOLLOW UP                   [id number]    Lin Landsman  2  [place] MFM FETAL BPP WO NON               76819.01    CHARLIE PICKENS     STRESS ----------------------------------------------------------------------  #  Order #                     Accession #                Episode #  1  [id number]                   [id number]                 [id number]  2  [id number]                   [id number]                 [id number] ---------------------------------------------------------------------- Indications  Gestational diabetes in pregnancy, diet        O24.410  controlled  [redacted] weeks gestation of pregnancy                Z3A.63  Advanced maternal age multigravida 77+,        O43.523  third trimester ---------------------------------------------------------------------- Fetal Evaluation  Num Of Fetuses:         1  Fetal Heart Rate(bpm):  152  Cardiac Activity:  Observed  Presentation:           Cephalic  Placenta:               Anterior  Amniotic Fluid  AFI FV:      Within normal limits  AFI Sum(cm)     %Tile       Largest Pocket(cm)  20.22           76          8.8  RUQ(cm)       RLQ(cm)       LUQ(cm)        LLQ(cm)  8.8           4.4           3.6            3.42 ---------------------------------------------------------------------- Biophysical Evaluation  Amniotic F.V:   Within normal limits       F. Tone:        Observed  F. Movement:    Observed                   Score:          8/8  F. Breathing:    Observed ---------------------------------------------------------------------- Biometry  BPD:      84.9  mm     G. Age:  34w 1d         78  %    CI:        71.66   %    70 - 86                                                          FL/HC:      19.7   %    19.9 - 21.5  HC:      319.3  mm     G. Age:  36w 0d         86  %    HC/AC:      0.96        0.96 - 1.11  AC:      332.4  mm     G. Age:  37w 1d       > 99  %    FL/BPD:     74.1   %    71 - 87  FL:       62.9  mm     G. Age:  32w 4d         26  %    FL/AC:      18.9   %    20 - 24  HUM:      55.4  mm     G. Age:  32w 2d         43  %  LV:        4.5  mm  Est. FW:    2709  gm           6 lb     98  % ---------------------------------------------------------------------- OB History  Blood Type:   A+  Gravidity:    5         Term:   2        Prem:   0  SAB:   2  TOP:          0       Ectopic:  0        Living: 2 ---------------------------------------------------------------------- Gestational Age  LMP:           34w 6d        Date:  02/01/20                 EDD:   11/07/20  U/S Today:     35w 0d                                        EDD:   11/06/20  Best:          33w 0d     Det. By:  U/S  (06/26/20)          EDD:   11/20/20 ---------------------------------------------------------------------- Cervix Uterus Adnexa  Cervix  Length:           3.55  cm.  Normal appearance by transabdominal scan.  Uterus  No abnormality visualized.  Right Ovary  Not visualized.  Left Ovary  Not visualized.  Adnexa  No abnormality visualized. ---------------------------------------------------------------------- Comments  This patient was seen for a follow up growth scan due to  recently diagnosed gestational diabetes.  The patient reports  that she may have to be placed on medication treatment for  her gestational diabetes soon.  She was informed that the fetal growth measures large for  her gestational age (98th percentile).  Borderline  polyhydramnios is noted today with an  MVP of >8 cm.  However, the total AFI of 20.2 cm is within normal limits.  A biophysical profile performed today was 8 out of 8.  The implications and management of diabetes in pregnancy  was discussed in detail with the patient. She was advised that  our goals for her fingerstick values are fasting values of 90-95  or less and two-hour postprandials of 120 or less.  Should the  majority of her fingerstick values be above these values, she  may have to be started on insulin or metformin to help her  achieve better glycemic control. The patient was advised that  getting her fingerstick values as close to these goals as  possible would provide her with the most optimal obstetrical  outcome.  The patient was advised that obtaining optimal  glycemic control will hopefully prevent fetal macrosomia and  an indicated cesarean delivery.  We will continue to follow her closely with weekly biophysical  profiles.  A biophysical profile scheduled in 1 week.  We will reassess  the fetal growth closer to delivery in 4 weeks. ----------------------------------------------------------------------                   Ma Rings, MD Electronically Signed Final Report   10/02/2020 12:33 pm ----------------------------------------------------------------------  [place] MFM OB FOLLOW UP  Result Date: 10/30/2020 ----------------------------------------------------------------------  OBSTETRICS REPORT                       (Signed Final 10/30/2020 12:04 pm) ---------------------------------------------------------------------- Patient Info  ID #:       [id number]                          D.O.B.:  Dec 18, 1980 (39 yrs)  Name:       Casey Seta  Meyer                   Visit Date: 10/30/2020 10:48 am ---------------------------------------------------------------------- Performed By  Attending:        Ma Rings MD         Ref. Address:     [address]                                                             [address]  Performed By:     Tommie Raymond BS,       [facility]  Referred By:      [facility] ---------------------------------------------------------------------- Orders  #  Description                           Code        Ordered By  1  [place] MFM FETAL BPP WO NON               76819.01    YU FANG     STRESS  2  [place] MFM OB FOLLOW UP                   [id number]    YU FANG ----------------------------------------------------------------------  #  Order #                     Accession #                Episode #  1  [id number]                   [id number]                 [id number]  2  [id number]                   [id number]                 [id number] ---------------------------------------------------------------------- Indications  Gestational diabetes in pregnancy,             O24.415  controlled by oral hypoglycemic drugs  Advanced maternal age multigravida 35+,  Z61.096  third trimester  [redacted] weeks gestation of pregnancy                Z3A.37  Fetal abnormality - other known or             O35.9XX0  suspected (ICEF, CP cyst)  Other mental disorder complicating             O99.340  pregnancy, unspecified trimester  Poor obstetric history: Previous               O09.299  preeclampsia / eclampsia/gestational HTN  Encounter for other antenatal screening        Z36.2  follow-up ---------------------------------------------------------------------- Fetal Evaluation  Num Of Fetuses:         1  Fetal Heart Rate(bpm):  126  Cardiac Activity:       Observed  Presentation:           Cephalic  Placenta:               Posterior  P. Cord Insertion:      Previously Visualized  Amniotic Fluid  AFI FV:      Within normal limits  AFI Sum(cm)     %Tile       Largest Pocket(cm)  21.8            84          6.4  RUQ(cm)        RLQ(cm)       LUQ(cm)        LLQ(cm)  6.4           6             4.7            4.7 ---------------------------------------------------------------------- Biophysical Evaluation  Amniotic F.V:   Pocket => 2 cm             F. Tone:        Observed  F. Movement:    Observed                   Score:          8/8  F. Breathing:   Observed ---------------------------------------------------------------------- Biometry  BPD:      90.2  mm     G. Age:  36w 4d         53  %    CI:        74.58   %    70 - 86                                                          FL/HC:      21.1   %    20.8 - 22.6  HC:      331.5  mm     G. Age:  37w 6d         43  %    HC/AC:      0.92        0.92 - 1.05  AC:      358.7  mm     G. Age:  39w 5d       > 99  %    FL/BPD:     77.6   %  71 - 87  FL:         70  mm     G. Age:  35w 6d         22  %    FL/AC:      19.5   %    20 - 24  LV:        6.2  mm  Est. FW:    3458  gm    7 lb 10 oz      87  % ---------------------------------------------------------------------- OB History  Blood Type:   A+  Gravidity:    5         Term:   2        Prem:   0        SAB:   2  TOP:          0       Ectopic:  0        Living: 2 ---------------------------------------------------------------------- Gestational Age  LMP:           38w 6d        Date:  02/01/20                 EDD:   11/07/20  U/S Today:     37w 4d                                        EDD:   11/16/20  Best:          37w 0d     Det. By:  U/S  (06/26/20)          EDD:   11/20/20 ---------------------------------------------------------------------- Anatomy  Cranium:               Previously seen        LVOT:                   Previously seen  Cavum:                 Previously seen        Aortic Arch:            Previously seen  Ventricles:            Appears normal         Ductal Arch:            Previously seen  Choroid Plexus:        Previously seen        Diaphragm:              Appears normal  Cerebellum:            Previously seen         Stomach:                Appears normal, left                                                                        sided  Posterior Fossa:       Previously seen  Abdomen:                Previously seen  Nuchal Fold:           Not applicable (>20    Abdominal Wall:         Previously seen                         wks GA)  Face:                  Orbits and profile     Cord Vessels:           Previously seen                         previously seen  Lips:                  Previously seen        Kidneys:                Appear normal  Palate:                Previously seen        Bladder:                Appears normal  Thoracic:              Previously seen        Spine:                  Previously seen  Heart:                 Appears normal; EIF    Upper Extremities:      Previously seen  RVOT:                  Previously seen        Lower Extremities:      Previously seen  Other:  Female gender previously seen. VC, 3VV and 3VTV visualized          previously. Technically difficult due to advanced GA and fetal position. ---------------------------------------------------------------------- Cervix Uterus Adnexa  Cervix  Normal appearance by transabdominal scan.  Uterus  No abnormality visualized.  Right Ovary  Within normal limits.  Left Ovary  Not visualized. No adnexal mass visualized.  Cul De Sac  No free fluid seen.  Adnexa  No abnormality visualized. ---------------------------------------------------------------------- Comments  This patient was seen for a follow up growth scan due to due  to gestational diabetes that is currently treated with  Metformin.  She denies any problems since her last exam.  She was informed that the fetal growth and amniotic fluid  level appears appropriate for her gestational age.  A biophysical profile performed today was 8 out of 8.  She will return in 1 week for another biophysical profile.  Due to gestational diabetes that is currently treated with  Metformin, delivery is  recommended at around 39 weeks.  Based on today's ultrasound, the estimated fetal weight at  around 39 weeks will most likely be between 8 to 8-1/2  pounds.  This weight will be about 1 pound larger than her  prior child. ----------------------------------------------------------------------                   Ma Rings, MD Electronically Signed Final Report   10/30/2020 12:04 pm ----------------------------------------------------------------------  [place] MFM OB FOLLOW UP  Result Date: 10/02/2020 ----------------------------------------------------------------------  OBSTETRICS REPORT                       (Signed Final 10/02/2020 12:33 pm) ---------------------------------------------------------------------- Patient Info  ID #:       [id number]                          D.O.B.:  07-Apr-1981 (39 yrs)  Name:       Casey Meyer                   Visit Date: 10/02/2020 10:50 am ---------------------------------------------------------------------- Performed By  Attending:        Ma Rings MD         Ref. Address:     [address]                                                             [address]  Performed By:     Hurman Horn          Location[facility]                                     [facility]  Referred By:      [facility] ---------------------------------------------------------------------- Orders  #  Description                           Code        Ordered By  1  [place] MFM OB FOLLOW UP                   67124.58    Lin Landsman  2  [place] MFM FETAL BPP WO NON               09983.38    CHARLIE PICKENS     STRESS ----------------------------------------------------------------------  #  Order #  Accession #                Episode #  1  [id number]                    [id number]                 [id number]  2  [id number]                   [id number]                 [id number] ---------------------------------------------------------------------- Indications  Gestational diabetes in pregnancy, diet        O24.410  controlled  [redacted] weeks gestation of pregnancy                Z3A.50  Advanced maternal age multigravida 61+,        O21.523  third trimester ---------------------------------------------------------------------- Fetal Evaluation  Num Of Fetuses:         1  Fetal Heart Rate(bpm):  152  Cardiac Activity:       Observed  Presentation:           Cephalic  Placenta:               Anterior  Amniotic Fluid  AFI FV:      Within normal limits  AFI Sum(cm)     %Tile       Largest Pocket(cm)  20.22           76          8.8  RUQ(cm)       RLQ(cm)       LUQ(cm)        LLQ(cm)  8.8           4.4           3.6            3.42 ---------------------------------------------------------------------- Biophysical Evaluation  Amniotic F.V:   Within normal limits       F. Tone:        Observed  F. Movement:    Observed                   Score:          8/8  F. Breathing:   Observed ---------------------------------------------------------------------- Biometry  BPD:      84.9  mm     G. Age:  34w 1d         78  %    CI:        71.66   %    70 - 86                                                          FL/HC:      19.7   %    19.9 - 21.5  HC:      319.3  mm     G. Age:  36w 0d         86  %    HC/AC:      0.96        0.96 - 1.11  AC:      332.4  mm     G. Age:  37w 1d       >  99  %    FL/BPD:     74.1   %    71 - 87  FL:       62.9  mm     G. Age:  32w 4d         26  %    FL/AC:      18.9   %    20 - 24  HUM:      55.4  mm     G. Age:  32w 2d         43  %  LV:        4.5  mm  Est. FW:    2709  gm           6 lb     98  % ---------------------------------------------------------------------- OB History  Blood Type:   A+  Gravidity:    5         Term:   2        Prem:   0         SAB:   2  TOP:          0       Ectopic:  0        Living: 2 ---------------------------------------------------------------------- Gestational Age  LMP:           34w 6d        Date:  02/01/20                 EDD:   11/07/20  U/S Today:     35w 0d                                        EDD:   11/06/20  Best:          33w 0d     Det. By:  U/S  (06/26/20)          EDD:   11/20/20 ---------------------------------------------------------------------- Cervix Uterus Adnexa  Cervix  Length:           3.55  cm.  Normal appearance by transabdominal scan.  Uterus  No abnormality visualized.  Right Ovary  Not visualized.  Left Ovary  Not visualized.  Adnexa  No abnormality visualized. ---------------------------------------------------------------------- Comments  This patient was seen for a follow up growth scan due to  recently diagnosed gestational diabetes.  The patient reports  that she may have to be placed on medication treatment for  her gestational diabetes soon.  She was informed that the fetal growth measures large for  her gestational age (98th percentile).  Borderline  polyhydramnios is noted today with an MVP of >8 cm.  However, the total AFI of 20.2 cm is within normal limits.  A biophysical profile performed today was 8 out of 8.  The implications and management of diabetes in pregnancy  was discussed in detail with the patient. She was advised that  our goals for her fingerstick values are fasting values of 90-95  or less and two-hour postprandials of 120 or less.  Should the  majority of her fingerstick values be above these values, she  may have to be started on insulin or metformin to help her  achieve better glycemic control. The patient was advised that  getting her fingerstick values as close to these goals as  possible would provide her with the most  optimal obstetrical  outcome.  The patient was advised that obtaining optimal  glycemic control will hopefully prevent fetal macrosomia and  an indicated  cesarean delivery.  We will continue to follow her closely with weekly biophysical  profiles.  A biophysical profile scheduled in 1 week.  We will reassess  the fetal growth closer to delivery in 4 weeks. ----------------------------------------------------------------------                   Ma Rings, MD Electronically Signed Final Report   10/02/2020 12:33 pm ----------------------------------------------------------------------   Assessment and Plan:  Pregnancy: [id number] at [redacted]w[redacted]d 1. Gestational hypertension, third trimester 2. Hx of preeclampsia, prior pregnancy, currently pregnant, third trimester Meets criteria for GHTN now, needs evaluation for preeclampsia. Given that she is >37 weeks, needs delivery. IOL recommended. Patient agreed to this, will head to hospital now.  L&D providers on call and staff at [facility] notified.  3. Gestational diabetes mellitus (GDM) in third trimester controlled on oral hypoglycemic drug On Metformin  4. Excessive fetal growth affecting management of pregnancy in third trimester, single or unspecified fetus Latest EFW 87%, AC >99%  5. Multigravida of advanced maternal age in third trimester 6. [redacted] weeks gestation of pregnancy 7. Supervision of high risk pregnancy, antepartum To [facility] for IOL.   I discussed the assessment and treatment plan with the patient. The patient was provided an opportunity to ask questions and all were answered. The patient agreed with the plan and demonstrated an understanding of the instructions. The patient was advised to call back or seek an in-person office evaluation/go to [facility] at [facility] for any urgent or concerning symptoms. Please refer to After Visit Summary for other counseling recommendations.   I provided 10 minutes of face-to-face time during this encounter.  Return for Postpartum BP check.  Future Appointments  Date Time Provider Department Center  11/06/2020  8:30 AM [facility] NURSE [facility]   11/06/2020  8:45 AM [facility] US4 [facility]  11/08/2020 11:30 AM [place] Bing, MD [facility]  11/22/2020 10:30 AM [place] Bing, MD [facility]    Jaynie Collins, MD Center for [facility], [facility]

## 2020-11-01 NOTE — H&P (Signed)
OBSTETRIC ADMISSION HISTORY AND PHYSICAL  Casey Meyer is a 40 y.o. female [phone] with IUP at 64w2dby 19 wk u/s presenting for IOL-gHTN. She does endorse increased leg swelling after her hike yesterday. She reports +FMs, No LOF, no VB, no blurry vision, headaches and RUQ pain.  She plans on breast feeding. She request BTL for birth control, signed 09/11/20. [facility]  Dating: By 19 wk u/s --->  Estimated Date of Delivery: 11/20/20  Sono:    10/30/20_0 , CWD, normal anatomy, cephalic presentation, posterior placental lie, 3458g, 87% EFW   Prenatal History/Complications:  gHTN AN2TFT(Metformin) AMA COVID in pregnancy (03/2020) History of preE prior pregnancy  Past Medical History: Past Medical History:  Diagnosis Date  . Bipolar 1 disorder ([place]   . Gestational diabetes   . History of COVID-19 05/22/2020   Late sept 2021  . Hx of preeclampsia, prior pregnancy, currently pregnant, third trimester     Past Surgical History: Past Surgical History:  Procedure Laterality Date  . DILATION AND CURETTAGE OF UTERUS    . NOSE SURGERY      Obstetrical History: OB History    Gravida  5   Para  2   Term  2   Preterm      AB  2   Living  2     SAB  2   IAB      Ectopic      Multiple      Live Births  2           Social History Social History   Socioeconomic History  . Marital status: Married    Spouse name: Not on file  . Number of children: Not on file  . Years of education: Not on file  . Highest education level: Not on file  Occupational History  . Not on file  Tobacco Use  . Smoking status: Former Smoker    Packs/day: 0.25    Years: 24.00    Pack years: 6.00    Types: Cigarettes    Quit date: 08/21/2020    Years since quitting: 0.1  . Smokeless tobacco: Never Used  Vaping Use  . Vaping Use: Never used  Substance and Sexual Activity  . Alcohol use: No  . Drug use: No  . Sexual activity: Yes    Birth  control/protection: Injection  Other Topics Concern  . Not on file  Social History Narrative  . Not on file   Social Determinants of Health   Financial Resource Strain: Not on file  Food Insecurity: Not on file  Transportation Needs: Not on file  Physical Activity: Not on file  Stress: Not on file  Social Connections: Not on file    Family History: Family History  Problem Relation Age of Onset  . Cancer Father   . Diabetes Father   . Diabetes Paternal Grandmother     Allergies: Allergies  Allergen Reactions  . Bee Venom Swelling    Medications Prior to Admission  Medication Sig Dispense Refill Last Dose  . Accu-Chek Softclix Lancets lancets 1 each by Other route 4 (four) times daily. 100 each 12   . Blood Glucose Monitoring Suppl (ACCU-CHEK GUIDE) w/Device KIT 1 Device by Does not apply route 4 (four) times daily. 1 kit 0   . diphenhydramine-acetaminophen (TYLENOL PM) 25-500 MG TABS tablet Take 1 tablet by mouth at bedtime as needed.     . ferrous sulfate (FERROUSUL) 325 (65 FE)  MG tablet Take 1 tablet (325 mg total) by mouth every other day. 60 tablet 0   . glucose blood (ACCU-CHEK GUIDE) test strip Use to check blood sugars four times a day was instructed 50 each 12   . metFORMIN (GLUCOPHAGE) 500 MG tablet Take 1 tablet (500 mg total) by mouth at bedtime. 30 tablet 1   . Prenatal Vit-Fe Fumarate-FA (MULTIVITAMIN-PRENATAL) 27-0.8 MG TABS tablet Take 1 tablet by mouth every other day.        Review of Systems   All systems reviewed and negative except as stated in HPI  Last menstrual period 02/01/2020. General appearance: alert, cooperative and no distress Lungs: normal respiratory effort Heart: regular rate and rhythm Abdomen: soft, non-tender; gravid Pelvic: as noted below Extremities: Homans sign is negative, no sign of DVT Presentation: cephalic by cervical exam Fetal monitoringBaseline: 130 bpm, Variability: Good {> 6 bpm), Accelerations: Reactive and  Decelerations: Absent Uterine activityFrequency: Every 1-6 minutes     Prenatal labs: ABO, Rh: A/Positive/-- (11/02 1129) Antibody: Negative (11/02 1129) Rubella: 1.54 (11/02 1129) RPR: Non Reactive (02/22 0903)  HBsAg: Negative (11/02 1129)  HIV: Non Reactive (02/22 0903)  GBS: Negative/-- (04/07 1351)  2 hr Glucola failed Genetic screening  declined Anatomy US isolated IEFLV, right choroid plexus cyst, otherwise normal  Prenatal Transfer Tool  Maternal Diabetes: Yes:  Diabetes Type:  Insulin/Medication controlled Genetic Screening: Normal Maternal Ultrasounds/Referrals: Isolated EIF (echogenic intracardiac focus) and Isolated choroid plexus cyst Fetal Ultrasounds or other Referrals:  None Maternal Substance Abuse:  No Significant Maternal Medications:  Meds include: Other: metformin Significant Maternal Lab Results: Group B Strep negative  No results found for this or any previous visit (from the past 24 hour(s)).  Patient Active Problem List   Diagnosis Date Noted  . Gestational hypertension, third trimester 11/01/2020  . Hx of preeclampsia, prior pregnancy, currently pregnant, third trimester   . LGA (large for gestational age) fetus affecting management of mother 10/04/2020  . Anemia in pregnancy 09/12/2020  . GDM (gestational diabetes mellitus) 09/12/2020  . Fetal abnormality (R CPC, LVEIF) on ultrasound 07/17/2020  . Supervision of high risk pregnancy, antepartum 05/22/2020  . AMA (advanced maternal age) multigravida 35+ 05/22/2020  . History of COVID-19 05/22/2020  . History of maternal cervical laceration, currently pregnant 05/22/2020    Assessment/Plan:  Casey Meyer is a 40 y.o. [id number] at 65w2dhere for IOL-GHTN.  #IOL: Discussed IOL process with patient, given cervical exam will dose cytotec. If unchanged at next check can place FB after discussion with patient.  #Pain: PRN #FWB: Cat 1 #ID: GBS [id number] #MOF: breast #MOC: BTL, signed 09/11/20 #Circ:  n/a #gHTN/history of preE prior pregnancy: asymptomatic. preE labs pending. Continue to monitor. #A2GDM: EFW 87%ile, 3458g on 10/30/20. CBG q4 in latent labor and q2 in active labor. #BMI 3[place] MD  11/01/2020, 12:23 PM

## 2020-11-01 NOTE — Progress Notes (Signed)
Labor Progress Note Casey Meyer is a 40 y.o. [phone] at [redacted]w[redacted]d presented for IOL for PEC w/o SF S: Feeling some cramping w contractions   O:  BP (!) 141/73   Pulse 73   Temp 98.8 F (37.1 C) (Oral)   Ht 5\' 2"  (1.575 m)   Wt 89.8 kg   LMP 02/01/2020 (Approximate)   BMI 36.20 kg/m  EFM: baseline 125/mod variability/pos accels/no decels   CVE: Dilation: 3 Effacement (%): Thick Station: -2 Presentation: Vertex Exam by:: Dr. [email or web address]   A&P: 40 y.o. 20 [redacted]w[redacted]d here for IOL for PEC w/o SF   #Labor: s/p cytotec x2. Cervix still thick but dilated >3, do not think FB would help. Will proceed w additional dose cytotec if contractions space out   #A2GDM: EFW 87%ile, 3458g AC >99%ile @37  weeks. CBG q4hr. Dystocia precautions at delivery.  #PEC w/o SF: UPC .42. PreE labs stable. BP has been mild range.  #Hypokalemia: reports has been chronic issue, has not had further evaluation. IV potassium and magnesium repletion, recheck labs.   #Pain: per patient request #FWB: cat I  #GBS negative    [redacted]w[redacted]d, MD 9:52 PM

## 2020-11-02 ENCOUNTER — Inpatient Hospital Stay (HOSPITAL_COMMUNITY): Payer: Medicaid Other | Admitting: Anesthesiology

## 2020-11-02 LAB — ELECTROLYTE PANEL
Anion gap: 6 (ref 5–15)
Anion gap: 7 (ref 5–15)
CO2: 27 mmol/L (ref 22–32)
CO2: 25 mmol/L (ref 22–32)
Chloride: 107 mmol/L (ref 98–111)
Chloride: 109 mmol/L (ref 98–111)
Potassium: 2.7 mmol/L — CL (ref 3.5–5.1)
Potassium: 3.5 mmol/L (ref 3.5–5.1)
Sodium: 140 mmol/L (ref 135–145)
Sodium: 141 mmol/L (ref 135–145)

## 2020-11-02 LAB — CBC
HCT: 32.1 % — ABNORMAL LOW (ref 36.0–46.0)
Hemoglobin: 11.1 g/dL — ABNORMAL LOW (ref 12.0–15.0)
MCH: 32 pg (ref 26.0–34.0)
MCHC: 34.6 g/dL (ref 30.0–36.0)
MCV: 92.5 fL (ref 80.0–100.0)
Platelets: 242 10*3/uL (ref 150–400)
RBC: 3.47 MIL/uL — ABNORMAL LOW (ref 3.87–5.11)
RDW: 15.1 % (ref 11.5–15.5)
WBC: 14.5 10*3/uL — ABNORMAL HIGH (ref 4.0–10.5)
nRBC: 0 % (ref 0.0–0.2)

## 2020-11-02 LAB — GLUCOSE, CAPILLARY
Glucose-Capillary: 100 mg/dL — ABNORMAL HIGH (ref 70–99)
Glucose-Capillary: 105 mg/dL — ABNORMAL HIGH (ref 70–99)
Glucose-Capillary: 124 mg/dL — ABNORMAL HIGH (ref 70–99)
Glucose-Capillary: 166 mg/dL — ABNORMAL HIGH (ref 70–99)
Glucose-Capillary: 85 mg/dL (ref 70–99)
Glucose-Capillary: 88 mg/dL (ref 70–99)
Glucose-Capillary: 91 mg/dL (ref 70–99)

## 2020-11-02 LAB — BASIC METABOLIC PANEL
Anion gap: 6 (ref 5–15)
BUN: <5 mg/dL — ABNORMAL LOW (ref 6–20)
CO2: 24 mmol/L (ref 22–32)
Calcium: 8.5 mg/dL — ABNORMAL LOW (ref 8.9–10.3)
Chloride: 106 mmol/L (ref 98–111)
Creatinine, Ser: 0.49 mg/dL (ref 0.44–1.00)
GFR, Estimated: >60 mL/min (ref >60)
Glucose, Bld: 89 mg/dL (ref 70–99)
Potassium: 2.8 mmol/L — ABNORMAL LOW (ref 3.5–5.1)
Sodium: 136 mmol/L (ref 135–145)

## 2020-11-02 LAB — MAGNESIUM: Magnesium: 1.7 mg/dL (ref 1.7–2.4)

## 2020-11-02 LAB — RPR: RPR Ser Ql: NONREACTIVE

## 2020-11-02 MED ORDER — POTASSIUM CHLORIDE 10 MEQ/100ML IV SOLN
10.0000 meq | INTRAVENOUS | Status: AC
  Administered 2020-11-02 (×2): 10 meq via INTRAVENOUS
  Filled 2020-11-02 (×2): qty 100

## 2020-11-02 MED ORDER — EPHEDRINE 5 MG/ML INJ: 10.0000 mg | INTRAVENOUS | Status: DC | PRN

## 2020-11-02 MED ORDER — DIPHENHYDRAMINE HCL 50 MG/ML IJ SOLN: 12.5000 mg | INTRAMUSCULAR | Status: DC | PRN

## 2020-11-02 MED ORDER — PHENYLEPHRINE 40 MCG/ML (10ML) SYRINGE FOR IV PUSH (FOR BLOOD PRESSURE SUPPORT): 80.0000 ug | PREFILLED_SYRINGE | INTRAVENOUS | Status: DC | PRN

## 2020-11-02 MED ORDER — LIDOCAINE HCL (PF) 1 % IJ SOLN
INTRAMUSCULAR | Status: DC | PRN
  Administered 2020-11-02: 10 mL via EPIDURAL
  Administered 2020-11-02: 2 mL via EPIDURAL

## 2020-11-02 MED ORDER — LACTATED RINGERS IV SOLN
500.0000 mL | Freq: Once | INTRAVENOUS | Status: AC
  Administered 2020-11-02: 500 mL via INTRAVENOUS

## 2020-11-02 MED ORDER — DIPHENHYDRAMINE HCL 50 MG/ML IJ SOLN
25.0000 mg | Freq: Once | INTRAMUSCULAR | Status: AC
  Administered 2020-11-02: 25 mg via INTRAVENOUS
  Filled 2020-11-02: qty 1

## 2020-11-02 MED ORDER — FENTANYL-BUPIVACAINE-NACL 0.5-0.125-0.9 MG/250ML-% EP SOLN
EPIDURAL | Status: DC | PRN
  Administered 2020-11-02: 12 mL/h via EPIDURAL

## 2020-11-02 MED ORDER — POTASSIUM CHLORIDE 20 MEQ PO PACK
40.0000 meq | PACK | Freq: Two times a day (BID) | ORAL | Status: DC
  Administered 2020-11-02: 40 meq via ORAL
  Filled 2020-11-02 (×2): qty 2

## 2020-11-02 MED ORDER — FENTANYL-BUPIVACAINE-NACL 0.5-0.125-0.9 MG/250ML-% EP SOLN
12.0000 mL/h | EPIDURAL | Status: DC | PRN
  Administered 2020-11-02 – 2020-11-03 (×2): 12 mL/h via EPIDURAL
  Filled 2020-11-02 (×3): qty 250

## 2020-11-02 NOTE — Progress Notes (Signed)
Patient CBG taken at 2128 on left hand; middle finger CBG reading 166 mg/dl. Will take again in 2 hours.  Provider notified.

## 2020-11-02 NOTE — Progress Notes (Signed)
Casey Meyer is a 40 y.o. [id number] at [redacted]w[redacted]d admitted for IOL for pre-e without severe features. She is also A2GDM on metformin.   Patient's IOL was started yesterday at 1:50 pm, s/p cytotec x 2, Pitocin was started at 2 am. Has been on pitocin for 16 hours; pitocin now at 26.  Subjective: comfortable with epidural  Objective: BP 123/62   Pulse 85   Temp 97.7 F (36.5 C) (Oral)   Resp 16   Ht 5\' 2"  (1.575 m)   Wt 89.8 kg   LMP 02/01/2020 (Approximate)   SpO2 97%   BMI 36.20 kg/m  Total I/O In: -  Out: 950 [Urine:950]  FHT:  FHR: 135 bpm, variability: moderate,  accelerations:  Present,  decelerations:  Absent UC:   irregular, every 2-3 minutes  SVE:   Dilation: 5 (cervix edematous) Effacement (%): 70 Station: -2 Exam by:: [email or web address], RN  Pitocin @ 26 mu/min  Labs: Lab Results  Component Value Date   WBC 14.5 (H) 11/02/2020   HGB 11.1 (L) 11/02/2020   HCT 32.1 (L) 11/02/2020   MCV 92.5 11/02/2020   PLT 242 11/02/2020    Assessment / Plan: Patient now 5-6, cervix is edematous so patient repositioned and Benadryl was given at 5 pm.   -IUPC and FSE placed due to late decelerations -patient repositioned to help with rotation and descent  Labor: protracted Fetal Wellbeing:  Category II ; will continue to monitor closely Pain Control:  Epidural Pre-eclampsia: asymptomatic; BPs stable and BGM is stable I/D:  GBS neg Anticipated MOD: NSVB  11/04/2020 Jens Siems CNM 11/02/2020, 5:44 PM

## 2020-11-02 NOTE — Progress Notes (Signed)
Labor Progress Note Otelia Hettinger is a 40 y.o. [id number] at [redacted]w[redacted]d presented for IOL for PEC w/o SF S: getting comfortable with epidural   O:  BP 120/69   Pulse 71   Temp 98.7 F (37.1 C) (Oral)   Resp 20   Ht 5\' 2"  (1.575 m)   Wt 89.8 kg   LMP 02/01/2020 (Approximate)   SpO2 97%   BMI 36.20 kg/m  EFM: baseline 125/mod variability/pos accels/no decels   CVE: Dilation: 4 Effacement (%): 20 Station: -2 Presentation: Vertex Exam by:: [email or web address], RN   A&P: 40 y.o. 20 [redacted]w[redacted]d here for IOL for PEC w/o SF   #Labor: s/p cytotec x2. Started on pitocin at 0200. Continue to titrate pitocin.   #A2GDM: EFW 87%ile, 3458g AC >99%ile @37  weeks. CBG q4hr. Dystocia precautions at delivery.  #PEC w/o SF: UPC .42. PreE labs stable. BP has been mild range.  #Hypokalemia: reports has been chronic issue, has not had further evaluation. IV potassium and magnesium repletion, recheck labs. Per anesthesia, may not be able to have BTL if potassium continues to be low.   #Pain: per patient request #FWB: cat I  #GBS negative    [redacted]w[redacted]d, MD 5:58 AM

## 2020-11-02 NOTE — Anesthesia Procedure Notes (Signed)
Epidural Patient location during procedure: OB Start time: 11/02/2020 4:55 AM End time: 11/02/2020 5:05 AM  Staffing Anesthesiologist: Lannie Fields, DO Performed: anesthesiologist   Preanesthetic Checklist Completed: patient identified, IV checked, risks and benefits discussed, monitors and equipment checked, pre-op evaluation and timeout performed  Epidural Patient position: sitting Prep: DuraPrep and site prepped and draped Patient monitoring: continuous pulse ox, blood pressure, heart rate and cardiac monitor Approach: midline Location: L3-L4 Injection technique: LOR air  Needle:  Needle type: Tuohy  Needle gauge: 17 G Needle length: 9 cm Needle insertion depth: 6 cm Catheter type: closed end flexible Catheter size: 19 Gauge Catheter at skin depth: 11 cm Test dose: negative  Assessment Sensory level: T8 Events: blood not aspirated, injection not painful, no injection resistance, no paresthesia and negative IV test  Additional Notes Patient identified. Risks/Benefits/Options discussed with patient including but not limited to bleeding, infection, nerve damage, paralysis, failed block, incomplete pain control, headache, blood pressure changes, nausea, vomiting, reactions to medication both or allergic, itching and postpartum back pain. Confirmed with bedside nurse the patient's most recent platelet count. Confirmed with patient that they are not currently taking any anticoagulation, have any bleeding history or any family history of bleeding disorders. Patient expressed understanding and wished to proceed. All questions were answered. Sterile technique was used throughout the entire procedure. Please see nursing notes for vital signs. Test dose was given through epidural catheter and negative prior to continuing to dose epidural or start infusion. Warning signs of high block given to the patient including shortness of breath, tingling/numbness in hands, complete motor  block, or any concerning symptoms with instructions to call for help. Patient was given instructions on fall risk and not to get out of bed. All questions and concerns addressed with instructions to call with any issues or inadequate analgesia.  Reason for block:procedure for pain

## 2020-11-02 NOTE — Progress Notes (Signed)
Casey Meyer is a 40 y.o. [id number] at [redacted]w[redacted]d admitted for induction of labor due to pre-e without severe features  Subjective: comfortable with epidural; she denies ha, blurry vision, vision changes, floating spots.   Objective: BP 130/86   Pulse 78   Temp 98 F (36.7 C) (Oral)   Resp 18   Ht 5\' 2"  (1.575 m)   Wt 89.8 kg   LMP 02/01/2020 (Approximate)   SpO2 97%   BMI 36.20 kg/m  No intake/output data recorded.  FHT:  FHR: 120 bpm, variability: moderate,  accelerations:  Present,  decelerations:  Absent UC:   irregular, every 2-3 minutes  SVE:   Dilation: 4 Effacement (%): 50 Station: -2 Exam by:: Tiffanny Lamarche,CNM  Pitocin @ 16 mu/min  Labs: Lab Results  Component Value Date   WBC 14.5 (H) 11/02/2020   HGB 11.1 (L) 11/02/2020   HCT 32.1 (L) 11/02/2020   MCV 92.5 11/02/2020   PLT 242 11/02/2020    Assessment / Plan: IOL d/t pre-eclampsia without severe features, s/p cytotec. Pitocin currently at 16 mu/min.  -AROM clear fluid Labor: early Fetal Wellbeing:  Category I Pain Control:  Epidural Pre-eclampsia:  no severe features I/D:  GBS neg Anticipated MOD: NSVB  11/04/2020 CNM, 11/02/2020, 10:32 AM

## 2020-11-02 NOTE — Progress Notes (Addendum)
Casey Meyer is a 40 y.o. [id number] at [redacted]w[redacted]d admitted for  iol for pre-e without severe features and GDMA2. Also found to have low potassium level.   Subjective: lying on left side, napping. no pain at this time.   Objective: BP 119/72   Pulse 85   Temp 97.7 F (36.5 C) (Oral)   Resp 16   Ht 5\' 2"  (1.575 m)   Wt 89.8 kg   LMP 02/01/2020 (Approximate)   SpO2 97%   BMI 36.20 kg/m  Total I/O In: -  Out: 950 [Urine:950]  FHT:  FHR: 120 bpm, variability: moderate,  accelerations:  Present,  decelerations:Absent UC:   regular, every 1-2 minutes  SVE:   Dilation: 4 Effacement (%): 70 Station: -2 Exam by:: [email or web address], RN  Pitocin @ 20 mu/min  Labs: Lab Results  Component Value Date   WBC 14.5 (H) 11/02/2020   HGB 11.1 (L) 11/02/2020   HCT 32.1 (L) 11/02/2020   MCV 92.5 11/02/2020   PLT 242 11/02/2020    Assessment / Plan: Still 4-5 cm per last RN check; patient is now repositioned and on peanut ball between legs.  Blood sugar q 4 hours; well controlled [place] at 1200 was 3.5; d/c K replacement and will recheck tomorrow CBC and CMP were normal, no signs of HELLP  Labor:  early labor; continue to titrate pitocin, continue to check K+ levels and blood sugar q 4.  Fetal Wellbeing:  Category I Pain Control:  Epidural Pre-eclampsia: asymptomatic I/D:  GBS neg Anticipated MOD: NSVB  11/04/2020 Zechariah Bissonnette CNM 11/02/2020, 2:16 PM

## 2020-11-02 NOTE — Anesthesia Preprocedure Evaluation (Signed)
Anesthesia Evaluation  Patient identified by MRN, date of birth, ID band Patient awake    Reviewed: Allergy & Precautions, Patient's Chart, lab work & pertinent test results  Airway Mallampati: II  TM Distance: >3 FB Neck ROM: Full    Dental no notable dental hx.    Pulmonary former smoker,  Quit smoking 08/2020, 6 pack year history    Pulmonary exam normal breath sounds clear to auscultation       Cardiovascular hypertension (gest HTN), Normal cardiovascular exam Rhythm:Regular Rate:Normal     Neuro/Psych PSYCHIATRIC DISORDERS Bipolar Disorder negative neurological ROS     GI/Hepatic negative GI ROS, Neg liver ROS,   Endo/Other  diabetes, GestationalChronic hypokalemia per pt- 2.4 on admission, not on any medications to attribute this to and does not take supplements- has been repleted somewhat at the urging of anesthesiologist, now up to 2.8  Renal/GU negative Renal ROS  negative genitourinary   Musculoskeletal negative musculoskeletal ROS (+)   Abdominal (+) + obese,   Peds negative pediatric ROS (+)  Hematology  (+) Blood dyscrasia, anemia , hct 32.1, plt 242   Anesthesia Other Findings   Reproductive/Obstetrics (+) Pregnancy 3rd pregnancy, but last one was over 10 years ago                             Anesthesia Physical Anesthesia Plan  ASA: III and emergent  Anesthesia Plan: Epidural   Post-op Pain Management:    Induction:   PONV Risk Score and Plan: 2  Airway Management Planned: Natural Airway  Additional Equipment: None  Intra-op Plan:   Post-operative Plan:   Informed Consent: I have reviewed the patients History and Physical, chart, labs and discussed the procedure including the risks, benefits and alternatives for the proposed anesthesia with the patient or authorized representative who has indicated his/her understanding and acceptance.       Plan Discussed  with:   Anesthesia Plan Comments:         Anesthesia Quick Evaluation

## 2020-11-03 ENCOUNTER — Encounter (HOSPITAL_COMMUNITY): Payer: Self-pay | Admitting: Obstetrics and Gynecology

## 2020-11-03 ENCOUNTER — Encounter (HOSPITAL_COMMUNITY)
Admission: AD | Disposition: A | Discharge status: Home / Self Care | Payer: Self-pay | Source: Home / Self Care | Attending: Obstetrics & Gynecology

## 2020-11-03 DIAGNOSIS — O24425 Gestational diabetes mellitus in childbirth, controlled by oral hypoglycemic drugs: Secondary | ICD-10-CM

## 2020-11-03 DIAGNOSIS — O1404 Mild to moderate pre-eclampsia, complicating childbirth: Secondary | ICD-10-CM

## 2020-11-03 DIAGNOSIS — Z3A37 37 weeks gestation of pregnancy: Secondary | ICD-10-CM

## 2020-11-03 DIAGNOSIS — Z302 Encounter for sterilization: Secondary | ICD-10-CM

## 2020-11-03 DIAGNOSIS — O411230 Chorioamnionitis, third trimester, not applicable or unspecified: Secondary | ICD-10-CM

## 2020-11-03 LAB — ELECTROLYTE PANEL
Anion gap: 14 (ref 5–15)
CO2: 20 mmol/L — ABNORMAL LOW (ref 22–32)
Chloride: 102 mmol/L (ref 98–111)
Potassium: 2.7 mmol/L — CL (ref 3.5–5.1)
Sodium: 136 mmol/L (ref 135–145)

## 2020-11-03 LAB — GLUCOSE, CAPILLARY
Glucose-Capillary: 108 mg/dL — ABNORMAL HIGH (ref 70–99)
Glucose-Capillary: 108 mg/dL — ABNORMAL HIGH (ref 70–99)
Glucose-Capillary: 102 mg/dL — ABNORMAL HIGH (ref 70–99)
Glucose-Capillary: 100 mg/dL — ABNORMAL HIGH (ref 70–99)
Glucose-Capillary: 106 mg/dL — ABNORMAL HIGH (ref 70–99)
Glucose-Capillary: 116 mg/dL — ABNORMAL HIGH (ref 70–99)
Glucose-Capillary: 107 mg/dL — ABNORMAL HIGH (ref 70–99)

## 2020-11-03 LAB — CREATININE, SERUM
Creatinine, Ser: 0.7 mg/dL (ref 0.44–1.00)
GFR, Estimated: >60 mL/min (ref >60)

## 2020-11-03 SURGERY — Surgical Case
Anesthesia: Epidural

## 2020-11-03 MED ORDER — GENTAMICIN SULFATE 40 MG/ML IJ SOLN
5.0000 mg/kg | Freq: Once | INTRAVENOUS | Status: AC
  Administered 2020-11-03: 330 mg via INTRAVENOUS
  Filled 2020-11-03: qty 8.25

## 2020-11-03 MED ORDER — DEXAMETHASONE SODIUM PHOSPHATE 4 MG/ML IJ SOLN
INTRAMUSCULAR | Status: AC
  Filled 2020-11-03: qty 1

## 2020-11-03 MED ORDER — ACETAMINOPHEN 500 MG PO TABS
1000.0000 mg | ORAL_TABLET | ORAL | Status: AC
  Administered 2020-11-03: 1000 mg via ORAL
  Filled 2020-11-03: qty 2

## 2020-11-03 MED ORDER — COCONUT OIL OIL
1.0000 "application " | TOPICAL_OIL | Status: DC | PRN
  Administered 2020-11-04: 1 via TOPICAL

## 2020-11-03 MED ORDER — POTASSIUM CHLORIDE 10 MEQ/100ML IV SOLN
10.0000 meq | INTRAVENOUS | Status: AC
  Administered 2020-11-03 (×2): 10 meq via INTRAVENOUS
  Filled 2020-11-03 (×4): qty 100

## 2020-11-03 MED ORDER — NALOXONE HCL 4 MG/10ML IJ SOLN
1.0000 ug/kg/h | INTRAVENOUS | Status: DC | PRN
  Filled 2020-11-03: qty 5

## 2020-11-03 MED ORDER — SCOPOLAMINE 1 MG/3DAYS TD PT72: 1.0000 | MEDICATED_PATCH | Freq: Once | TRANSDERMAL | Status: DC

## 2020-11-03 MED ORDER — ALBUMIN HUMAN 5 % IV SOLN
INTRAVENOUS | Status: AC
  Filled 2020-11-03: qty 500

## 2020-11-03 MED ORDER — STERILE WATER FOR IRRIGATION IR SOLN
Status: DC | PRN
  Administered 2020-11-03: 1000 mL

## 2020-11-03 MED ORDER — MENTHOL 3 MG MT LOZG: 1.0000 | LOZENGE | OROMUCOSAL | Status: DC | PRN

## 2020-11-03 MED ORDER — FENTANYL CITRATE (PF) 100 MCG/2ML IJ SOLN
INTRAMUSCULAR | Status: DC | PRN
  Administered 2020-11-03 (×2): 50 ug via INTRAVENOUS

## 2020-11-03 MED ORDER — FERROUS SULFATE 325 (65 FE) MG PO TABS: 325.0000 mg | ORAL_TABLET | Freq: Two times a day (BID) | ORAL | Status: DC

## 2020-11-03 MED ORDER — MORPHINE SULFATE (PF) 0.5 MG/ML IJ SOLN
INTRAMUSCULAR | Status: AC
  Filled 2020-11-03: qty 10

## 2020-11-03 MED ORDER — FERROUS SULFATE 325 (65 FE) MG PO TABS
325.0000 mg | ORAL_TABLET | ORAL | Status: DC
  Administered 2020-11-05: 325 mg via ORAL
  Filled 2020-11-03: qty 1

## 2020-11-03 MED ORDER — ACETAMINOPHEN 325 MG PO TABS
650.0000 mg | ORAL_TABLET | ORAL | Status: DC | PRN
  Administered 2020-11-04 – 2020-11-06 (×3): 650 mg via ORAL
  Filled 2020-11-03 (×3): qty 2

## 2020-11-03 MED ORDER — DIPHENHYDRAMINE HCL 25 MG PO CAPS: 25.0000 mg | ORAL_CAPSULE | Freq: Four times a day (QID) | ORAL | Status: DC | PRN

## 2020-11-03 MED ORDER — NALBUPHINE HCL 10 MG/ML IJ SOLN: 5.0000 mg | INTRAMUSCULAR | Status: DC | PRN

## 2020-11-03 MED ORDER — NALOXONE HCL 0.4 MG/ML IJ SOLN: 0.4000 mg | INTRAMUSCULAR | Status: DC | PRN

## 2020-11-03 MED ORDER — PHENYLEPHRINE 40 MCG/ML (10ML) SYRINGE FOR IV PUSH (FOR BLOOD PRESSURE SUPPORT)
PREFILLED_SYRINGE | INTRAVENOUS | Status: AC
  Filled 2020-11-03: qty 10

## 2020-11-03 MED ORDER — MORPHINE SULFATE (PF) 0.5 MG/ML IJ SOLN
INTRAMUSCULAR | Status: DC | PRN
  Administered 2020-11-03: 2 mg via EPIDURAL

## 2020-11-03 MED ORDER — DIPHENHYDRAMINE HCL 50 MG/ML IJ SOLN: 12.5000 mg | INTRAMUSCULAR | Status: DC | PRN

## 2020-11-03 MED ORDER — TRANEXAMIC ACID-NACL 1000-0.7 MG/100ML-% IV SOLN
INTRAVENOUS | Status: DC | PRN
  Administered 2020-11-03: 1000 mg via INTRAVENOUS

## 2020-11-03 MED ORDER — DIPHENHYDRAMINE HCL 50 MG/ML IJ SOLN
25.0000 mg | Freq: Once | INTRAMUSCULAR | Status: AC
  Administered 2020-11-03: 25 mg via INTRAVENOUS
  Filled 2020-11-03: qty 1

## 2020-11-03 MED ORDER — CLINDAMYCIN PHOSPHATE 900 MG/50ML IV SOLN
900.0000 mg | Freq: Once | INTRAVENOUS | Status: AC
  Administered 2020-11-03: 900 mg via INTRAVENOUS

## 2020-11-03 MED ORDER — FENTANYL CITRATE (PF) 100 MCG/2ML IJ SOLN
INTRAMUSCULAR | Status: AC
  Filled 2020-11-03: qty 2

## 2020-11-03 MED ORDER — NALBUPHINE HCL 10 MG/ML IJ SOLN: 5.0000 mg | Freq: Once | INTRAMUSCULAR | Status: DC | PRN

## 2020-11-03 MED ORDER — DIPHENHYDRAMINE HCL 25 MG PO CAPS
25.0000 mg | ORAL_CAPSULE | ORAL | Status: DC | PRN
Start: 2020-11-03 — End: 2020-11-06

## 2020-11-03 MED ORDER — POTASSIUM CHLORIDE 10 MEQ/100ML IV SOLN
10.0000 meq | Freq: Once | INTRAVENOUS | Status: DC
  Filled 2020-11-03: qty 100

## 2020-11-03 MED ORDER — LACTATED RINGERS IV SOLN: INTRAVENOUS | Status: DC | PRN

## 2020-11-03 MED ORDER — KETOROLAC TROMETHAMINE 30 MG/ML IJ SOLN
30.0000 mg | Freq: Four times a day (QID) | INTRAMUSCULAR | Status: AC
  Administered 2020-11-03 – 2020-11-04 (×3): 30 mg via INTRAVENOUS
  Filled 2020-11-03 (×3): qty 1

## 2020-11-03 MED ORDER — DIBUCAINE (PERIANAL) 1 % EX OINT: 1.0000 "application " | TOPICAL_OINTMENT | CUTANEOUS | Status: DC | PRN

## 2020-11-03 MED ORDER — MORPHINE SULFATE (PF) 10 MG/ML IV SOLN: INTRAVENOUS | Status: DC | PRN

## 2020-11-03 MED ORDER — MEPERIDINE HCL 25 MG/ML IJ SOLN: 6.2500 mg | INTRAMUSCULAR | Status: DC | PRN

## 2020-11-03 MED ORDER — ALBUMIN HUMAN 5 % IV SOLN: INTRAVENOUS | Status: DC | PRN

## 2020-11-03 MED ORDER — SOD CITRATE-CITRIC ACID 500-334 MG/5ML PO SOLN: 30.0000 mL | ORAL | Status: DC

## 2020-11-03 MED ORDER — OXYTOCIN-SODIUM CHLORIDE 30-0.9 UT/500ML-% IV SOLN: 2.5000 [IU]/h | INTRAVENOUS | Status: AC

## 2020-11-03 MED ORDER — SENNOSIDES-DOCUSATE SODIUM 8.6-50 MG PO TABS
2.0000 | ORAL_TABLET | Freq: Every day | ORAL | Status: DC
  Administered 2020-11-04 – 2020-11-05 (×2): 2 via ORAL
  Filled 2020-11-03 (×2): qty 2

## 2020-11-03 MED ORDER — ZOLPIDEM TARTRATE 5 MG PO TABS: 5.0000 mg | ORAL_TABLET | Freq: Every evening | ORAL | Status: DC | PRN

## 2020-11-03 MED ORDER — SODIUM CHLORIDE 0.9 % IV SOLN
2.0000 g | Freq: Four times a day (QID) | INTRAVENOUS | Status: DC
  Administered 2020-11-03: 2 g via INTRAVENOUS
  Filled 2020-11-03: qty 2000

## 2020-11-03 MED ORDER — OXYCODONE HCL 5 MG PO TABS
5.0000 mg | ORAL_TABLET | ORAL | Status: DC | PRN
Start: 2020-11-03 — End: 2020-11-06
  Administered 2020-11-04 – 2020-11-06 (×9): 5 mg via ORAL
  Filled 2020-11-03 (×9): qty 1

## 2020-11-03 MED ORDER — SODIUM CHLORIDE 0.9% FLUSH: 3.0000 mL | INTRAVENOUS | Status: DC | PRN

## 2020-11-03 MED ORDER — CLINDAMYCIN PHOSPHATE 900 MG/50ML IV SOLN
INTRAVENOUS | Status: AC
  Filled 2020-11-03: qty 50

## 2020-11-03 MED ORDER — PRENATAL MULTIVITAMIN CH
1.0000 | ORAL_TABLET | Freq: Every day | ORAL | Status: DC
  Administered 2020-11-04 – 2020-11-05 (×2): 1 via ORAL
  Filled 2020-11-03 (×2): qty 1

## 2020-11-03 MED ORDER — SODIUM CHLORIDE 0.9 % IR SOLN
Status: DC | PRN
  Administered 2020-11-03: 1

## 2020-11-03 MED ORDER — LIDOCAINE-EPINEPHRINE (PF) 2 %-1:200000 IJ SOLN
INTRAMUSCULAR | Status: DC | PRN
  Administered 2020-11-03: 5 mL via EPIDURAL
  Administered 2020-11-03: 4 mL via EPIDURAL
  Administered 2020-11-03: 5 mL via EPIDURAL

## 2020-11-03 MED ORDER — OXYTOCIN-SODIUM CHLORIDE 30-0.9 UT/500ML-% IV SOLN
INTRAVENOUS | Status: DC | PRN
  Administered 2020-11-03: 300 mL via INTRAVENOUS

## 2020-11-03 MED ORDER — WITCH HAZEL-GLYCERIN EX PADS: 1.0000 "application " | MEDICATED_PAD | CUTANEOUS | Status: DC | PRN

## 2020-11-03 MED ORDER — PROMETHAZINE HCL 25 MG/ML IJ SOLN: 6.2500 mg | INTRAMUSCULAR | Status: DC | PRN

## 2020-11-03 MED ORDER — SODIUM CHLORIDE 0.9 % IV SOLN
500.0000 mg | INTRAVENOUS | Status: AC
  Administered 2020-11-03: 500 mg via INTRAVENOUS

## 2020-11-03 MED ORDER — TETANUS-DIPHTH-ACELL PERTUSSIS 5-2.5-18.5 LF-MCG/0.5 IM SUSY: 0.5000 mL | PREFILLED_SYRINGE | Freq: Once | INTRAMUSCULAR | Status: DC

## 2020-11-03 MED ORDER — MORPHINE SULFATE (PF) 0.5 MG/ML IJ SOLN
INTRAMUSCULAR | Status: DC | PRN
  Administered 2020-11-03: 3 mg via EPIDURAL

## 2020-11-03 MED ORDER — IBUPROFEN 600 MG PO TABS
600.0000 mg | ORAL_TABLET | Freq: Four times a day (QID) | ORAL | Status: DC
  Administered 2020-11-04 – 2020-11-06 (×9): 600 mg via ORAL
  Filled 2020-11-03 (×10): qty 1

## 2020-11-03 MED ORDER — ALBUMIN HUMAN 5 % IV SOLN
INTRAVENOUS | Status: AC
  Filled 2020-11-03: qty 250

## 2020-11-03 MED ORDER — ENOXAPARIN SODIUM 40 MG/0.4ML ~~LOC~~ SOLN
40.0000 mg | SUBCUTANEOUS | Status: DC
  Administered 2020-11-04 – 2020-11-05 (×2): 40 mg via SUBCUTANEOUS
  Filled 2020-11-03 (×2): qty 0.4

## 2020-11-03 MED ORDER — FENTANYL CITRATE (PF) 100 MCG/2ML IJ SOLN
INTRAMUSCULAR | Status: DC | PRN
  Administered 2020-11-03: 100 ug via EPIDURAL

## 2020-11-03 MED ORDER — ONDANSETRON HCL 4 MG/2ML IJ SOLN
INTRAMUSCULAR | Status: DC | PRN
  Administered 2020-11-03: 4 mg via INTRAVENOUS

## 2020-11-03 MED ORDER — TRANEXAMIC ACID-NACL 1000-0.7 MG/100ML-% IV SOLN
INTRAVENOUS | Status: AC
  Filled 2020-11-03: qty 100

## 2020-11-03 MED ORDER — PHENYLEPHRINE 40 MCG/ML (10ML) SYRINGE FOR IV PUSH (FOR BLOOD PRESSURE SUPPORT)
PREFILLED_SYRINGE | INTRAVENOUS | Status: DC | PRN
  Administered 2020-11-03: 80 ug via INTRAVENOUS
  Administered 2020-11-03: 120 ug via INTRAVENOUS
  Administered 2020-11-03: 80 ug via INTRAVENOUS
  Administered 2020-11-03: 120 ug via INTRAVENOUS
  Administered 2020-11-03: 80 ug via INTRAVENOUS

## 2020-11-03 MED ORDER — ONDANSETRON HCL 4 MG/2ML IJ SOLN: 4.0000 mg | Freq: Three times a day (TID) | INTRAMUSCULAR | Status: DC | PRN

## 2020-11-03 MED ORDER — SIMETHICONE 80 MG PO CHEW
80.0000 mg | CHEWABLE_TABLET | Freq: Three times a day (TID) | ORAL | Status: DC
  Administered 2020-11-03 – 2020-11-06 (×6): 80 mg via ORAL
  Filled 2020-11-03 (×7): qty 1

## 2020-11-03 MED ORDER — DEXAMETHASONE SODIUM PHOSPHATE 4 MG/ML IJ SOLN
INTRAMUSCULAR | Status: DC | PRN
  Administered 2020-11-03: 4 mg via INTRAVENOUS

## 2020-11-03 MED ORDER — ONDANSETRON HCL 4 MG/2ML IJ SOLN
INTRAMUSCULAR | Status: AC
  Filled 2020-11-03: qty 2

## 2020-11-03 MED ORDER — SIMETHICONE 80 MG PO CHEW: 80.0000 mg | CHEWABLE_TABLET | ORAL | Status: DC | PRN

## 2020-11-03 MED ORDER — KETOROLAC TROMETHAMINE 30 MG/ML IJ SOLN: 30.0000 mg | Freq: Once | INTRAMUSCULAR | Status: DC | PRN

## 2020-11-03 SURGICAL SUPPLY — 41 items
BENZOIN TINCTURE PRP APPL 2/3 (GAUZE/BANDAGES/DRESSINGS) ×2 IMPLANT
CHLORAPREP W/TINT 26ML (MISCELLANEOUS) ×2 IMPLANT
CLAMP CORD UMBIL (MISCELLANEOUS) IMPLANT
CLOTH BEACON ORANGE TIMEOUT ST (SAFETY) ×2 IMPLANT
DERMABOND ADVANCED (GAUZE/BANDAGES/DRESSINGS)
DERMABOND ADVANCED .7 DNX12 (GAUZE/BANDAGES/DRESSINGS) IMPLANT
DRSG OPSITE POSTOP 4X10 (GAUZE/BANDAGES/DRESSINGS) ×2 IMPLANT
ELECT REM PT RETURN 9FT ADLT (ELECTROSURGICAL) ×2
ELECTRODE REM PT RTRN 9FT ADLT (ELECTROSURGICAL) ×1 IMPLANT
EXTRACTOR VACUUM KIWI (MISCELLANEOUS) IMPLANT
GAUZE SPONGE 4X4 12PLY STRL LF (GAUZE/BANDAGES/DRESSINGS) ×2 IMPLANT
GLOVE BIOGEL PI IND STRL 7.0 (GLOVE) ×3 IMPLANT
GLOVE BIOGEL PI INDICATOR 7.0 (GLOVE) ×3
GLOVE ECLIPSE 6.5 STRL STRAW (GLOVE) ×2 IMPLANT
GOWN STRL REUS W/TWL LRG LVL3 (GOWN DISPOSABLE) ×6 IMPLANT
HEMOSTAT ARISTA ABSORB 3G PWDR (HEMOSTASIS) ×2 IMPLANT
KIT ABG SYR 3ML LUER SLIP (SYRINGE) IMPLANT
NEEDLE HYPO 25X5/8 SAFETYGLIDE (NEEDLE) IMPLANT
NS IRRIG 1000ML POUR BTL (IV SOLUTION) ×2 IMPLANT
PACK C SECTION WH (CUSTOM PROCEDURE TRAY) ×2 IMPLANT
PAD ABD 7.5X8 STRL (GAUZE/BANDAGES/DRESSINGS) ×2 IMPLANT
PAD OB MATERNITY 4.3X12.25 (PERSONAL CARE ITEMS) ×2 IMPLANT
PENCIL SMOKE EVAC W/HOLSTER (ELECTROSURGICAL) ×2 IMPLANT
RETAINER VISCERAL (MISCELLANEOUS) ×2 IMPLANT
RTRCTR C-SECT PINK 25CM LRG (MISCELLANEOUS) ×2 IMPLANT
STRIP CLOSURE SKIN 1/2X4 (GAUZE/BANDAGES/DRESSINGS) ×2 IMPLANT
STRIP CLOSURE SKIN 1/4X4 (GAUZE/BANDAGES/DRESSINGS) ×2 IMPLANT
SUT PLAIN 0 NONE (SUTURE) ×2 IMPLANT
SUT PLAIN 2 0 XLH (SUTURE) IMPLANT
SUT VIC AB 0 CT1 27 (SUTURE) ×2
SUT VIC AB 0 CT1 27XBRD ANBCTR (SUTURE) ×2 IMPLANT
SUT VIC AB 0 CTX 36 (SUTURE) ×4
SUT VIC AB 0 CTX36XBRD ANBCTRL (SUTURE) ×4 IMPLANT
SUT VIC AB 2-0 CT1 27 (SUTURE) ×1
SUT VIC AB 2-0 CT1 TAPERPNT 27 (SUTURE) ×1 IMPLANT
SUT VIC AB 3-0 SH 27 (SUTURE) ×1
SUT VIC AB 3-0 SH 27XBRD (SUTURE) ×1 IMPLANT
SUT VIC AB 4-0 KS 27 (SUTURE) ×2 IMPLANT
TOWEL OR 17X24 6PK STRL BLUE (TOWEL DISPOSABLE) ×2 IMPLANT
TRAY FOLEY W/BAG SLVR 14FR LF (SET/KITS/TRAYS/PACK) IMPLANT
WATER STERILE IRR 1000ML POUR (IV SOLUTION) ×2 IMPLANT

## 2020-11-03 NOTE — Progress Notes (Signed)
Labor Progress Note Casey Meyer is a 40 y.o. [id number] at [redacted]w[redacted]d presented for IOL for PEC w/o SF S: exhausted, feeling frustrated   O:  BP 131/66   Pulse (!) 148   Temp 99.8 F (37.7 C) (Oral)   Resp 18   Ht 5\' 2"  (1.575 m)   Wt 89.8 kg   LMP 02/01/2020 (Approximate)   SpO2 97%   BMI 36.20 kg/m  EFM: baseline 135/mod variability/pos accels/few late decels  MVU: adequate  CVE: Dilation: 6 Effacement (%): 70,80 Station: -1 Presentation: Vertex Exam by:: [email or web address], RN   A&P: 40 y.o. 20 [redacted]w[redacted]d here for IOL for PEC w/o SF   #Labor: s/p cytotec x2. Started on pitocin at 0200 on 4/15. AROM 1030. IUPC and FSE in place. Has been adequate for most of day and is currently on pitocin 30. Has made very slow progress but is now 7.5cm but with significant cervical swelling. Baby LOP. Will continue position changes, reassess in four hours.   #A2GDM: EFW 87%ile, 3458g AC >99%ile @37  weeks. CBG q2hr. Dystocia precautions at delivery. Recent CBG's elevated but downtrending. 88 > 166 ? 124. Will recheck in 2 hours and give aspart if needed.   #PEC w/o SF: UPC .42. PreE labs stable. BP has been mild range.  #Hypokalemia: s/p repletion. Recheck BMP in AM  #Pain: epidural #FWB: cat II but with good variability, few late decels during repositioning  #GBS negative    5/15, MD 12:53 AM

## 2020-11-03 NOTE — Discharge Summary (Signed)
Postpartum Discharge Summary  Date of Service updated4/18/22     Patient Name: Casey Meyer DOB: 07-29-1980 MRN: [id number]  Date of admission: 11/01/2020 Delivery date:11/03/2020  Delivering provider: Janyth Pupa  Date of discharge: 11/05/2020  Admitting diagnosis: Gestational hypertension, third trimester [O13.3] Intrauterine pregnancy: [redacted]w[redacted]d     Secondary diagnosis:  Active Problems:   Supervision of high risk pregnancy, antepartum   AMA (advanced maternal age) multigravida 35+   History of COVID-19   GDM (gestational diabetes mellitus)   LGA (large for gestational age) fetus affecting management of mother   Preeclampsia without severe features Chorioamnionitis   Hypokalemia  Additional problems: Hypomagnesiumemia     Discharge diagnosis: Term Pregnancy Delivered, Preeclampsia (mild) and GDM A2  Chorioamnionitis                                          Post partum procedures:none Augmentation: AROM, Pitocin and Cytotec Complications: [id number]  Hospital course: Induction of Labor With Cesarean Section   40 y.o. yo [id number] at [redacted]w[redacted]d was admitted to the hospital 11/01/2020 for induction of labor. Patient had a labor course significant for failure to progress.  Also noted to be febrile, started on Amp/Gent for suspected intra-amniotic infection. The patient went for cesarean section due to Arrest of Dilation. Delivery details are as follows: Membrane Rupture Time/Date: 9:45 AM ,11/02/2020   Delivery Method:C-Section, Low Transverse  Details of operation can be found in separate operative Note.  Patient had an uncomplicated postpartum course. She is ambulating, tolerating a regular diet, passing flatus, and urinating well.  Patient is discharged home in stable condition on 11/05/20.      Newborn Data: Birth date:11/03/2020  Birth time:10:08 AM  Gender:Female  Living status:Living  Apgars:8 ,9  Weight:3416 g                                 Magnesium Sulfate  received: No BMZ received: No Rhophylac:No MMR:N/A T-DaP: offered postpartum Flu: No Transfusion:No  Physical exam  Vitals:   11/04/20 0518 11/04/20 1401 11/04/20 2050 11/05/20 0500  BP: 128/73 134/70 137/79 136/81  Pulse: (!) 109 94  88  Resp: $Remo'18 20 18 18  'DADul$ Temp: 97.8 F (36.6 C) 98 F (36.7 C) 98.2 F (36.8 C) 98 F (36.7 C)  TempSrc: Oral  Oral Oral  SpO2:   98% 100%  Weight:      Height:       General: alert, cooperative and no distress Lochia: appropriate Uterine Fundus: firm Incision: Dressing is clean, dry, and intact DVT Evaluation: No evidence of DVT seen on physical exam. Labs: Lab Results  Component Value Date   WBC 16.6 (H) 11/04/2020   HGB 7.2 (L) 11/04/2020   HCT 21.2 (L) 11/04/2020   MCV 95.1 11/04/2020   PLT 178 11/04/2020   CMP Latest Ref Rng & Units 11/05/2020  Glucose 70 - 99 mg/dL 88  BUN 6 - 20 mg/dL <5(L)  Creatinine 0.44 - 1.00 mg/dL 0.63  Sodium 135 - 145 mmol/L 137  Potassium 3.5 - 5.1 mmol/L 2.6(LL)  Chloride 98 - 111 mmol/L 105  CO2 22 - 32 mmol/L 26  Calcium 8.9 - 10.3 mg/dL 7.9(L)  Total Protein 6.5 - 8.1 g/dL -  Total Bilirubin 0.3 - 1.2 mg/dL -  Alkaline Phos 38 - 126 U/L -  AST 15 - 41 U/L -  ALT 0 - 44 U/L -   Edinburgh Score: Edinburgh Postnatal Depression Scale Screening Tool 11/03/2020  I have been able to laugh and see the funny side of things. 0  I have looked forward with enjoyment to things. 0  I have blamed myself unnecessarily when things went wrong. 2  I have been anxious or worried for no good reason. 3  I have felt scared or panicky for no good reason. 2  Things have been getting on top of me. 0  I have been so unhappy that I have had difficulty sleeping. 2  I have felt sad or miserable. 1  I have been so unhappy that I have been crying. 1  The thought of harming myself has occurred to me. 0  Edinburgh Postnatal Depression Scale Total 11     After visit meds:  Allergies as of 11/05/2020      Reactions    Bee Venom Swelling      Medication List    STOP taking these medications   Accu-Chek Guide test strip Generic drug: glucose blood   Accu-Chek Guide w/Device Kit   Accu-Chek Softclix Lancets lancets   diphenhydramine-acetaminophen 25-500 MG Tabs tablet Commonly known as: TYLENOL PM   metFORMIN 500 MG tablet Commonly known as: GLUCOPHAGE     TAKE these medications   ferrous sulfate 325 (65 FE) MG tablet Commonly known as: FerrouSul Take 1 tablet (325 mg total) by mouth every other day.   ibuprofen 600 MG tablet Commonly known as: ADVIL Take 1 tablet (600 mg total) by mouth every 6 (six) hours.   Magnesium Oxide 400 MG Caps Take 1 capsule (400 mg total) by mouth daily.   multivitamin-prenatal 27-0.8 MG Tabs tablet Take 1 tablet by mouth every other day.   oxyCODONE 5 MG immediate release tablet Commonly known as: Oxy IR/ROXICODONE Take 1 tablet (5 mg total) by mouth every 4 (four) hours as needed for severe pain.   potassium chloride SA 20 MEQ tablet Commonly known as: KLOR-CON Take 2 tablets (40 mEq total) by mouth daily.        Discharge home in stable condition Infant Feeding: Breast Infant Disposition:home with mother Discharge instruction: per After Visit Summary and Postpartum booklet. Activity: Advance as tolerated. Pelvic rest for 6 weeks.  Diet: routine diet Future Appointments:1 week for Incision and BP check, repeat blood tests, then 4 wks Follow up Visit:  I sent a message for them to call you   [facility]. Schedule an appointment as soon as possible for a visit in 1 week(s).   Specialty: Obstetrics and Gynecology Contact information: [address] [place] [phone]               Please schedule this patient for a In person postpartum visit in 1 week with the following provider: Any provider. Additional Postpartum F/U:Incision check 1 week    Glucola in 4 weeks High risk pregnancy complicated by: GDM, HTN and hypokalemia Delivery mode:  C-Section, Low Transverse  Anticipated Birth Control:  BTL with C/S   11/05/2020 Hansel Feinstein, CNM

## 2020-11-03 NOTE — Op Note (Addendum)
PreOp Diagnosis:  Intrauterine pregnancy @ [redacted]w[redacted]d Failure to progress- arrest of dilation-7cm Suspected Intra-amniotic infection PostOp Diagnosis: same Procedure: Primary C-section, Bilateral tubal ligation Surgeon: Dr. Myna Hidalgo Anesthesia: epidural Complications: hysterotomy extension EBL: 1400cc UOP: 225cc Fluids: 1300cc   Findings: Female infant from vertex- asynclitic OP presentation.  Normal uterus tubes and ovaries bilaterally.  PROCEDURE:  Informed consent was obtained from the patient with risks, benefits, complications, treatment options, and expected outcomes discussed with the patient.  The patient concurred with the proposed plan, giving informed consent with form signed.   The patient was taken to Operating Room, and identified with the procedure verified as C-Section Delivery with Time Out. With induction of anesthesia, the patient was prepped and draped in the usual sterile fashion. A Pfannenstiel incision was made and carried down through the subcutaneous tissue to the fascia. The fascia was incised in the midline and extended transversely. The superior aspect of the fascial incision was grasped with Kochers elevated and the underlying muscle dissected off. The inferior aspect of the facial incision was in similar fashion, grasped elevated and rectus muscles dissected off. The peritoneum was identified and entered. Peritoneal incision was extended longitudinally. The utero-vesical peritoneal reflection was identified and incised transversely with the [facility] scissors, the incision extended laterally and the bladder flap created digitally. A low transverse uterine incision was made and the infants head delivered atraumatically. Infant was noted to be asynclitic and OP presentation.  After the umbilical cord was clamped and cut cord blood was obtained for evaluation.   The placenta was removed intact and appeared normal. Excessive bleeding was noted from the hysterotomy with  extension left inferior portion of the incision. The uterine incision was closed with running locked sutures of 0 Vicryl and a second layer of the same stitch was used in an imbricating fashion.  Care was taken to evaluate on the left side- extension ~ 2cm inferiorly- closed in a running lock fashion.  Care was taken to stay along the uterus, avoiding the broad ligament.  Hemostasis was obtained.  Attention was turned to the left side, where the right fallopian tube was  grasped with Babcock forceps and exteriorized until the fimbria was visualized. A small incision using the bovie was made in a portion of the avascular mesosalpinx.  Using individual free ties on each side, the tube was ligated and cut.  A segment of the fallopian tube was removed.  Excellent hemostasis was noted. An identical procedure was carried out on the opposite side. Again hemostasis was adequate. The pericolic gutters were then cleared of all clots and debris. The hysterotomy was re-examined and appeared hemostatic.  Arista was placed.  The Jon Gills was removed, the peritoneum was closed in a running fashion. The fascia was then reapproximated with running sutures of 0 Vicryl. The subcutaneous tissue was reapproximated with 2-0 plain gut suture.  The skin was closed with 4-0 vicryl in a subcuticular fashion.  Instrument, sponge, and needle counts were correct prior the abdominal closure and at the conclusion of the case. The patient was taken to recovery in stable condition.  Myna Hidalgo, DO Attending Obstetrician & Gynecologist, [facility] for [employer], [facility]

## 2020-11-03 NOTE — Progress Notes (Signed)
Labor Progress Note Casey Meyer is a 40 y.o. [id number] at [redacted]w[redacted]d presented for IOL for PEC w/o SF S:  Feeling a lot of pressure   O:  BP 138/74   Pulse 91   Temp 99 F (37.2 C) (Oral)   Resp 18   Ht 5\' 2"  (1.575 m)   Wt 89.8 kg   LMP 02/01/2020 (Approximate)   SpO2 97%   BMI 36.20 kg/m  EFM: baseline 145/mod variability/pos accels/occasional variable decels MVU: adequate  CVE: Dilation: 8 Effacement (%): 70,80 Station: 0 Presentation: Vertex Exam by:: Dr. [email or web address] Anterior cervical swelling   A&P: 40 y.o. 20 [redacted]w[redacted]d here for IOL for PEC w/o SF   #Labor: s/p cytotec x2. Started on pitocin at 0200 on 4/15. AROM 1030. IUPC and FSE in place. Has been adequate for most of day and on pitocin 30 for 6 hours. Baby LOP with significant cervical swelling. Discussed options with patient and concern for arrest of dilation. Discussed possibility of cesarean section with patient and she is understanding. Will trial pitocin break and reevaluate in four hours. Continue position changes.   #A2GDM: EFW 87%ile, 3458g AC >99%ile @37  weeks. CBG q2hr. Dystocia precautions at delivery. Recent CBG's mildly elevated 108>108>102.  #PEC w/o SF: UPC .42. PreE labs stable. BP has been mild range.  #Hypokalemia: potassium this AM 2.7. IV potassium ordered.   #Pain: epidural #FWB: cat I overall #GBS negative    5/15, MD 6:55 AM

## 2020-11-03 NOTE — Lactation Note (Signed)
This note was copied from a baby's chart. Lactation Consultation Note  Patient Name: Casey Meyer Hearty LKGMW'N Date: 11/03/2020 Reason for consult: Initial assessment;Mother's request;Difficult latch;1st time breastfeeding;Early term 37-38.6wks;Maternal endocrine disorder (Anemia, GHTN) Age:40 years   LC assisted latching infant in football and transferring to opposite breasts. Mom states she had low milk supply in the past and requested the dEPB. Infant latched with signs of milk transfer.   Mom's nipples are erect and colostrum noted.   Infant requires chin tug to bring out her lower lip. Mom denies any pain with the latch.   Plan 1. To feed based on cues 8-12x in 24 hr period no more than 4 hrs without an attempt. Mom to offer both breasts and look for swallows.         2. Mom to offer any EBM via spoon or finger feeding.          3. Mom to pump DEBP q 3hrs for         4 I and O sheet reviewed.           5. LC brochure of inpatient and outpatient services reviewed.   Maternal Data Has patient been taught Hand Expression?: Yes Does the patient have breastfeeding experience prior to this delivery?: Yes How long did the patient breastfeed?: 1 week  Feeding Mother's Current Feeding Choice: Breast Milk  LATCH Score Latch: Repeated attempts needed to sustain latch, nipple held in mouth throughout feeding, stimulation needed to elicit sucking reflex.  Audible Swallowing: Spontaneous and intermittent  Type of Nipple: Everted at rest and after stimulation  Comfort (Breast/Nipple): Soft / non-tender  Hold (Positioning): Assistance needed to correctly position infant at breast and maintain latch.  LATCH Score: 8   Lactation Tools Discussed/Used Tools: Pump;Flanges Flange Size: 24 Breast pump type: Double-Electric Breast Pump Pump Education: Setup, frequency, and cleaning;Milk Storage Reason for Pumping: increase stimulation Pumping frequency: every 3 hrs for 15  minutes  Interventions Interventions: Breast feeding basics reviewed;Support pillows;Education;Assisted with latch;Position options;Skin to skin;Expressed milk;DEBP;Breast compression;Adjust position;Breast massage;Hand express  Discharge Pump: Manual WIC Program: Yes (Mom pump ordered coming in 2 weeks)  Consult Status Consult Status: Follow-up Date: 11/04/20 Follow-up type: In-patient    Zamarion Longest  Nicholson-Springer 11/03/2020, 5:37 PM

## 2020-11-03 NOTE — Transfer of Care (Signed)
Immediate Anesthesia Transfer of Care Note  Patient: Casey Meyer  Procedure(s) Performed: CESAREAN SECTION WITH BILATERAL TUBAL LIGATION (N/A )  Patient Location: PACU  Anesthesia Type:Epidural  Level of Consciousness: awake  Airway & Oxygen Therapy: Patient Spontanous Breathing  Post-op Assessment: Report given to RN  Post vital signs: Reviewed and stable  Last Vitals:  Vitals Value Taken Time  BP 109/62 11/03/20 1130  Temp    Pulse 99 11/03/20 1134  Resp 30 11/03/20 1134  SpO2 97 % 11/03/20 1134  Vitals shown include unvalidated device data.  Last Pain:  Vitals:   11/03/20 0901  TempSrc: Axillary  PainSc:          Complications: No complications documented.

## 2020-11-03 NOTE — Progress Notes (Signed)
Casey Meyer is a 40 y.o. [id number] at [redacted]w[redacted]d admitted for IOL 2/2 pre-e and A2GDM Subjective:  Patient feeling left sided groin pain.   She has been here since 11/01/2020 for IOL. Has had IUPC and been adequate, but no change for many hours. Pit was off at 0550 today has not been restarted.   Objective: BP 128/64   Pulse 97   Temp 98.3 F (36.8 C) (Axillary) Comment: pt c/o feeling hot, ice pack to forehead  Resp 20   Ht 5\' 2"  (1.575 m)   Wt 89.8 kg   LMP 02/01/2020 (Approximate)   SpO2 97%   BMI 36.20 kg/m  I/O last 3 completed shifts: In: -  Out: 2350 [Urine:2350] Total I/O In: -  Out: 325 [Urine:325]  FHT:  FHR: 140 bpm, variability: moderate,  accelerations:  Present,  decelerations:  Absent UC:   none SVE:   Dilation: 8 Effacement (%):  (swollen lip) Station: -1 Exam by:: [email or web address] CNM  Labs: Lab Results  Component Value Date   WBC 14.5 (H) 11/02/2020   HGB 11.1 (L) 11/02/2020   HCT 32.1 (L) 11/02/2020   MCV 92.5 11/02/2020   PLT 242 11/02/2020    Assessment / Plan: Arrest in active phase of labor  Labor: Patient requesting a c-section.  Preeclampsia:  no signs or symptoms of toxicity Fetal Wellbeing:  Category I Pain Control:  Epidural I/D:  n/a Anticipated MOD:  c-section   Dr. 11/04/2020 notified of patient request and no change. She will come talk with the patient.   Charlotta Newton DNP, CNM  11/03/20  9:15 AM

## 2020-11-03 NOTE — Anesthesia Postprocedure Evaluation (Signed)
Anesthesia Post Note  Patient: Miyana Mordecai  Procedure(s) Performed: CESAREAN SECTION WITH BILATERAL TUBAL LIGATION (N/A )     Patient location during evaluation: Mother Baby Anesthesia Type: Epidural Level of consciousness: awake and alert Pain management: pain level controlled Vital Signs Assessment: post-procedure vital signs reviewed and stable Respiratory status: spontaneous breathing, nonlabored ventilation and respiratory function stable Cardiovascular status: blood pressure returned to baseline and stable Postop Assessment: no apparent nausea or vomiting Anesthetic complications: no   No complications documented.  Last Vitals:  Vitals:   11/03/20 1350 11/03/20 1500  BP: 138/65 114/60  Pulse: 88 85  Resp: 16 18  Temp: 36.6 C 36.8 C  SpO2: 96% 96%    Last Pain:  Vitals:   11/03/20 1500  TempSrc: Oral  PainSc:                  Nelle Don Alanta Scobey

## 2020-11-03 NOTE — Progress Notes (Signed)
IFSE removed with tip intact 

## 2020-11-03 NOTE — Progress Notes (Signed)
At bedside to review management plan.  Pt re-evaluted- per Thressa Sheller, CNM, cervix remains unchanged.  Risk benefits and alternatives of cesarean section were discussed with the patient including but not limited to infection, bleeding, damage to bowel , bladder and baby with the need for further surgery. Pt voiced understanding and desires to proceed.  Additionally, she desires Bilateral tubal ligation reviewed with R&B including but not limited to bleeding, infection, injury to other organs, irreversibility and failure rate of 07/998. Questions and concerns were addressed and she desires to proceed.  Myna Hidalgo, DO Attending Obstetrician & Gynecologist, [facility] for [employer], [facility]

## 2020-11-04 ENCOUNTER — Encounter (HOSPITAL_COMMUNITY): Payer: Self-pay | Admitting: Obstetrics & Gynecology

## 2020-11-04 LAB — COMPREHENSIVE METABOLIC PANEL
ALT: 13 U/L (ref 0–44)
AST: 18 U/L (ref 15–41)
Albumin: 2.1 g/dL — ABNORMAL LOW (ref 3.5–5.0)
Alkaline Phosphatase: 51 U/L (ref 38–126)
Anion gap: 6 (ref 5–15)
BUN: <5 mg/dL — ABNORMAL LOW (ref 6–20)
CO2: 26 mmol/L (ref 22–32)
Calcium: 7.7 mg/dL — ABNORMAL LOW (ref 8.9–10.3)
Chloride: 105 mmol/L (ref 98–111)
Creatinine, Ser: 0.67 mg/dL (ref 0.44–1.00)
GFR, Estimated: >60 mL/min (ref >60)
Glucose, Bld: 112 mg/dL — ABNORMAL HIGH (ref 70–99)
Potassium: 2.6 mmol/L — CL (ref 3.5–5.1)
Sodium: 137 mmol/L (ref 135–145)
Total Bilirubin: 0.8 mg/dL (ref 0.3–1.2)
Total Protein: 4.4 g/dL — ABNORMAL LOW (ref 6.5–8.1)

## 2020-11-04 LAB — CBC
HCT: 21.2 % — ABNORMAL LOW (ref 36.0–46.0)
Hemoglobin: 7.2 g/dL — ABNORMAL LOW (ref 12.0–15.0)
MCH: 32.3 pg (ref 26.0–34.0)
MCHC: 34 g/dL (ref 30.0–36.0)
MCV: 95.1 fL (ref 80.0–100.0)
Platelets: 178 10*3/uL (ref 150–400)
RBC: 2.23 MIL/uL — ABNORMAL LOW (ref 3.87–5.11)
RDW: 15.3 % (ref 11.5–15.5)
WBC: 16.6 10*3/uL — ABNORMAL HIGH (ref 4.0–10.5)
nRBC: 0 % (ref 0.0–0.2)

## 2020-11-04 LAB — MAGNESIUM: Magnesium: 1.3 mg/dL — ABNORMAL LOW (ref 1.7–2.4)

## 2020-11-04 MED ORDER — POTASSIUM CHLORIDE 10 MEQ/100ML IV SOLN
10.0000 meq | INTRAVENOUS | Status: AC
  Administered 2020-11-04 (×2): 10 meq via INTRAVENOUS
  Filled 2020-11-04 (×2): qty 100

## 2020-11-04 MED ORDER — HYDROCOD POLST-CPM POLST ER 10-8 MG/5ML PO SUER: 5.0000 mL | Freq: Two times a day (BID) | ORAL | Status: DC | PRN

## 2020-11-04 MED ORDER — POTASSIUM CHLORIDE 10 MEQ/100ML IV SOLN
10.0000 meq | INTRAVENOUS | Status: AC
  Administered 2020-11-04 (×2): 10 meq via INTRAVENOUS
  Filled 2020-11-04 (×4): qty 100

## 2020-11-04 MED ORDER — MAGNESIUM SULFATE 2 GM/50ML IV SOLN
2.0000 g | Freq: Once | INTRAVENOUS | Status: AC
  Administered 2020-11-04: 2 g via INTRAVENOUS
  Filled 2020-11-04: qty 50

## 2020-11-04 MED ORDER — GUAIFENESIN ER 600 MG PO TB12
600.0000 mg | ORAL_TABLET | Freq: Two times a day (BID) | ORAL | Status: DC
  Administered 2020-11-04 – 2020-11-06 (×4): 600 mg via ORAL
  Filled 2020-11-04 (×4): qty 1

## 2020-11-04 MED ORDER — SODIUM CHLORIDE 0.9 % IV SOLN
500.0000 mg | Freq: Once | INTRAVENOUS | Status: AC
  Administered 2020-11-04: 500 mg via INTRAVENOUS
  Filled 2020-11-04: qty 25

## 2020-11-04 NOTE — Lactation Note (Addendum)
This note was copied from a baby's chart. Lactation Consultation Note  Patient Name: Casey Meyer [id number] Date: 11/04/2020 Reason for consult: Follow-up assessment;Mother's request;Difficult latch;1st time breastfeeding;Early term 37-38.6wks;Maternal endocrine disorder Age:39 hours   Infant sleeping following feeding of DBM 10 ml at 9 am. Mom states pumping not getting much. She only pumped once today. LC assessed flange size with pumping tends to swell so 27 flange is better fit than 24. Mom currently pumping. LC encouraged Mom to pump today in preparation for d/c in am to increase stimulation and letdown.   Mom getting an electric pump for home. LC did mention Mom can get a [facility] loaner but she was unsure if they had the funds at this time.   Mom to call when infant shows cues for assistance with sustaining the latch.   Mom compression stripe, some bruising on the left nipple. RN to provided coconut oil for nipple care. Mom denies any pain to the nipple with touch.  LC assisted Mom applying coconut oil before using the flange.  All questions answered at the end of the visit.   Upon return, Aunt fed infant 10 ml of DBM. LC asked if Mom wanted assistance latching infant at the breasts. Mom states she will pump and bottle feed. Mom in the middle of iron and potassium transfusion. LC reviewed with Mom to increase volumes if infant will not latch at the breasts given her age and hrs since birth. Aunt preparing DBM to give infant more to meet needs 15-30 ml increase as tolerated.   LC encouraged Mom to continue pumping to maintain milk supply and to call if she needs any further LC assistance.   Maternal Data    Feeding Mother's Current Feeding Choice: Breast Milk and Donor Milk  LATCH Score                    Lactation Tools Discussed/Used Tools: Pump;Flanges Flange Size: 24 Breast pump type: Double-Electric Breast Pump Pump Education: Setup, frequency, and cleaning;Milk  Storage Reason for Pumping: increase stimulation Pumping frequency: every 3 hrs for 15 minutes  Interventions    Discharge Pump: Manual  Consult Status Date: 11/05/20 Follow-up type: In-patient    Carisha Kantor  Nicholson-Springer 11/04/2020, 11:25 AM

## 2020-11-04 NOTE — Progress Notes (Signed)
Patient ID: Casey Meyer, female   DOB: January 02, 1981, 40 y.o.   MRN: [id number] Has developed a dry cough, requesting med Had a small cough when I saw her this morning  Normal oxygenation status No tachypnea  Will start with Mucinex 600mg  q 12 hours Then Tussionex q 12 h prn  RN asked to space dosage away from Oxycodone medication since cough syrup contains hydrocodone.

## 2020-11-04 NOTE — Progress Notes (Signed)
Date and time results received: 11/04/20    Test: Potassium Critical Value: 2.6  Name of Provider Notified: Wynelle Bourgeois, CNM  Orders Received? Or Actions Taken?: no new orders at this time

## 2020-11-04 NOTE — Progress Notes (Signed)
CSW acknowledged consult and completed chart review. CSW attempted to meet with MOB; however MOB was reported that she was in pain and requested to meet with CSW at a later time. CSW agreed to follow up with MOB later today or tomorrow. MOB agreed and thanked CSW.   Celso Sickle, LCSW Clinical Social Worker [facility] Cell#: [phone]

## 2020-11-04 NOTE — Progress Notes (Signed)
Subjective: Postpartum Day 1: Cesarean Delivery Patient reports incisional pain and tolerating PO.    Objective: Vital signs in last 24 hours: Temp:  [97.8 F (36.6 C)-99.1 F (37.3 C)] 97.8 F (36.6 C) (04/17 0518) Pulse Rate:  [81-109] 109 (04/17 0518) Resp:  [16-21] 18 (04/17 0518) BP: (109-138)/(60-79) 128/73 (04/17 0518) SpO2:  [90 %-100 %] 100 % (04/17 0009) I&O:  2040/5163 (incl EBL)  Physical Exam:  General: alert, cooperative and no distress Lochia: appropriate Uterine Fundus: firm Incision: no significant drainage DVT Evaluation: No evidence of DVT seen on physical exam.  Results for orders placed or performed during the hospital encounter of 11/01/20 (from the past 24 hour(s))  Glucose, capillary     Status: Abnormal   Collection Time: 11/03/20  8:09 AM  Result Value Ref Range   Glucose-Capillary 100 (H) 70 - 99 mg/dL  Glucose, capillary     Status: Abnormal   Collection Time: 11/03/20  9:40 AM  Result Value Ref Range   Glucose-Capillary 106 (H) 70 - 99 mg/dL  Glucose, capillary     Status: Abnormal   Collection Time: 11/03/20 11:44 AM  Result Value Ref Range   Glucose-Capillary 116 (H) 70 - 99 mg/dL  Creatinine, serum     Status: None   Collection Time: 11/03/20  2:37 PM  Result Value Ref Range   Creatinine, Ser 0.70 0.44 - 1.00 mg/dL   GFR, Estimated >29 >93 mL/min  Glucose, capillary     Status: Abnormal   Collection Time: 11/03/20 10:53 PM  Result Value Ref Range   Glucose-Capillary 107 (H) 70 - 99 mg/dL  CBC     Status: Abnormal   Collection Time: 11/04/20  4:29 AM  Result Value Ref Range   WBC 16.6 (H) 4.0 - 10.5 K/uL   RBC 2.23 (L) 3.87 - 5.11 MIL/uL   Hemoglobin 7.2 (L) 12.0 - 15.0 g/dL   HCT 71.6 (L) 96.7 - 89.3 %   MCV 95.1 80.0 - 100.0 fL   MCH 32.3 26.0 - 34.0 pg   MCHC 34.0 30.0 - 36.0 g/dL   RDW 81.0 17.5 - 10.2 %   Platelets 178 150 - 400 K/uL   nRBC 0.0 0.0 - 0.2 %  Comprehensive metabolic panel     Status: Abnormal   Collection  Time: 11/04/20  4:29 AM  Result Value Ref Range   Sodium 137 135 - 145 mmol/L   Potassium 2.6 (LL) 3.5 - 5.1 mmol/L   Chloride 105 98 - 111 mmol/L   CO2 26 22 - 32 mmol/L   Glucose, Bld 112 (H) 70 - 99 mg/dL   BUN <5 (L) 6 - 20 mg/dL   Creatinine, Ser 5.85 0.44 - 1.00 mg/dL   Calcium 7.7 (L) 8.9 - 10.3 mg/dL   Total Protein 4.4 (L) 6.5 - 8.1 g/dL   Albumin 2.1 (L) 3.5 - 5.0 g/dL   AST 18 15 - 41 U/L   ALT 13 0 - 44 U/L   Alkaline Phosphatase 51 38 - 126 U/L   Total Bilirubin 0.8 0.3 - 1.2 mg/dL   GFR, Estimated >27 >78 mL/min   Anion gap 6 5 - 15  Magnesium     Status: Abnormal   Collection Time: 11/04/20  4:29 AM  Result Value Ref Range   Magnesium 1.3 (L) 1.7 - 2.4 mg/dL    Assessment/Plan: Status post Cesarean section. Doing well postoperatively. Hypokalemia  Slightly low magnesium  Continue current care Will give MgSO4 2gm over 2  hrs Will give KCl x 4 runs Will order Venofer.  Casey Meyer 11/04/2020, 7:12 AM

## 2020-11-05 LAB — BASIC METABOLIC PANEL
Anion gap: 6 (ref 5–15)
Anion gap: 7 (ref 5–15)
BUN: <5 mg/dL — ABNORMAL LOW (ref 6–20)
BUN: <5 mg/dL — ABNORMAL LOW (ref 6–20)
CO2: 26 mmol/L (ref 22–32)
CO2: 30 mmol/L (ref 22–32)
Calcium: 7.9 mg/dL — ABNORMAL LOW (ref 8.9–10.3)
Calcium: 8.6 mg/dL — ABNORMAL LOW (ref 8.9–10.3)
Chloride: 105 mmol/L (ref 98–111)
Chloride: 105 mmol/L (ref 98–111)
Creatinine, Ser: 0.63 mg/dL (ref 0.44–1.00)
Creatinine, Ser: 0.64 mg/dL (ref 0.44–1.00)
GFR, Estimated: >60 mL/min (ref >60)
GFR, Estimated: >60 mL/min (ref >60)
Glucose, Bld: 88 mg/dL (ref 70–99)
Glucose, Bld: 78 mg/dL (ref 70–99)
Potassium: 2.6 mmol/L — CL (ref 3.5–5.1)
Potassium: 3 mmol/L — ABNORMAL LOW (ref 3.5–5.1)
Sodium: 137 mmol/L (ref 135–145)
Sodium: 142 mmol/L (ref 135–145)

## 2020-11-05 LAB — MAGNESIUM
Magnesium: 1.5 mg/dL — ABNORMAL LOW (ref 1.7–2.4)
Magnesium: 2 mg/dL (ref 1.7–2.4)

## 2020-11-05 MED ORDER — MAGNESIUM OXIDE 400 MG PO CAPS: 400.0000 mg | ORAL_CAPSULE | Freq: Every day | ORAL | 0 refills | Status: DC

## 2020-11-05 MED ORDER — IBUPROFEN 600 MG PO TABS: 600.0000 mg | ORAL_TABLET | Freq: Four times a day (QID) | ORAL | 0 refills | Status: DC

## 2020-11-05 MED ORDER — POTASSIUM CHLORIDE CRYS ER 20 MEQ PO TBCR: 40.0000 meq | EXTENDED_RELEASE_TABLET | Freq: Every day | ORAL | 1 refills | Status: DC

## 2020-11-05 MED ORDER — MAGNESIUM SULFATE 2 GM/50ML IV SOLN
2.0000 g | Freq: Once | INTRAVENOUS | Status: AC
  Administered 2020-11-05: 2 g via INTRAVENOUS
  Filled 2020-11-05: qty 50

## 2020-11-05 MED ORDER — OXYCODONE HCL 5 MG PO TABS: 5.0000 mg | ORAL_TABLET | ORAL | 0 refills | Status: DC | PRN

## 2020-11-05 MED ORDER — MAGNESIUM OXIDE 400 (241.3 MG) MG PO TABS
400.0000 mg | ORAL_TABLET | Freq: Once | ORAL | Status: AC
  Administered 2020-11-05: 400 mg via ORAL
  Filled 2020-11-05: qty 1

## 2020-11-05 MED ORDER — POTASSIUM CHLORIDE CRYS ER 20 MEQ PO TBCR
40.0000 meq | EXTENDED_RELEASE_TABLET | Freq: Once | ORAL | Status: AC
  Administered 2020-11-05: 40 meq via ORAL
  Filled 2020-11-05: qty 2

## 2020-11-05 MED ORDER — POTASSIUM CHLORIDE CRYS ER 20 MEQ PO TBCR
30.0000 meq | EXTENDED_RELEASE_TABLET | Freq: Once | ORAL | Status: AC
  Administered 2020-11-05: 30 meq via ORAL
  Filled 2020-11-05: qty 1

## 2020-11-05 NOTE — Lactation Note (Signed)
This note was copied from a baby's chart. Lactation Consultation Note  Patient Name: Casey Meyer POIPP'G Date: 11/05/2020 Reason for consult: Follow-up assessment;Early term 37-38.6wks;Infant weight loss;Other (Comment) (7 % weight  loss) Age:39 hours  As LC entered the room, baby noted to be very fussy.  LC offered to check the baby's diaper and changed a wet diaper.  Baby seemed gasey , was burped, and mom had pumped off 5 ml and LC fed  Her 4 ml , she calmed down for a short interval and was fussy again.  Mom had finished pumping one side and re- latched on the right breast for 7 mins and released/ baby calmer and settled in the crib.  Per mom exhausted. LC recommended continuing to feed at the breast and increasing the supplement afterwards until the baby is consistently satisfied and her milk comes in well.  Post pump after feedings when baby isn't cluster feeding.  Per mom her sister is coming back tonight so mom can gets some rest.  Baby on the Double photo tx.  Mom mentioned call her today and she will be able to get her pump in a few days.  LC recommended  If she is able to go home tomorrow to plan on calling WIC back to see if she can obtain the DEBP tomorrow due to post photo tx and baby has not yet consistent at the breast.  Mom receptive.   Maternal Data    Feeding Mother's Current Feeding Choice: Breast Milk and Donor Milk Nipple Type: Extra Slow Flow  LATCH Score Latch: Grasps breast easily, tongue down, lips flanged, rhythmical sucking.  Audible Swallowing: Spontaneous and intermittent  Type of Nipple: Everted at rest and after stimulation  Comfort (Breast/Nipple): Soft / non-tender  Hold (Positioning): No assistance needed to correctly position infant at breast.  LATCH Score: 10   Lactation Tools Discussed/Used Tools: Pump;Flanges Flange Size: 24 Breast pump type: Double-Electric Breast Pump  Interventions Interventions: Breast feeding basics  reviewed;Skin to skin;Adjust position;DEBP;Education  Discharge    Consult Status Consult Status: Follow-up Date: 11/06/20 Follow-up type: In-patient    Matilde Sprang Jovanne Riggenbach 11/05/2020, 5:43 PM
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
## 2020-11-05 NOTE — Progress Notes (Signed)
Casey Meyer CNM notified of patients potassium result being critical at 2.6. CNM states that patient will go home on PO supplement. No further orders at this time.

## 2020-11-05 NOTE — Discharge Instructions (Signed)
Hypokalemia Hypokalemia means that the amount of potassium in the blood is lower than normal. Potassium is a chemical (electrolyte) that helps regulate the amount of fluid in the body. It also stimulates muscle tightening (contraction) and helps nerves work properly. Normally, most of the body's potassium is inside cells, and only a very small amount is in the blood. Because the amount in the blood is so small, minor changes to potassium levels in the blood can be life-threatening. What are the causes? This condition may be caused by:  Antibiotic medicine.  Diarrhea or vomiting. Taking too much of a medicine that helps you have a bowel movement (laxative) can cause diarrhea and lead to hypokalemia.  Chronic kidney disease (CKD).  Medicines that help the body get rid of excess fluid (diuretics).  Eating disorders, such as bulimia.  Low magnesium levels in the body.  Sweating a lot. What are the signs or symptoms? Symptoms of this condition include:  Weakness.  Constipation.  Fatigue.  Muscle cramps.  Mental confusion.  Skipped heartbeats or irregular heartbeat (palpitations).  Tingling or numbness. How is this diagnosed? This condition is diagnosed with a blood test. How is this treated? This condition may be treated by:  Taking potassium supplements by mouth.  Adjusting the medicines that you take.  Eating more foods that contain a lot of potassium. If your potassium level is very low, you may need to get potassium through an IV and be monitored in the hospital. Follow these instructions at home:  Take over-the-counter and prescription medicines only as told by your health care provider. This includes vitamins and supplements.  Eat a healthy diet. A healthy diet includes fresh fruits and vegetables, whole grains, healthy fats, and lean proteins.  If instructed, eat more foods that contain a lot of potassium. This includes: ? Nuts, such as peanuts and  pistachios. ? Seeds, such as sunflower seeds and pumpkin seeds. ? Peas, lentils, and lima beans. ? Whole grain and bran cereals and breads. ? Fresh fruits and vegetables, such as apricots, avocado, bananas, cantaloupe, kiwi, oranges, tomatoes, asparagus, and potatoes. ? Orange juice. ? Tomato juice. ? Red meats. ? Yogurt.  Keep all follow-up visits as told by your health care provider. This is important.   Contact a health care provider if you:  Have weakness that gets worse.  Feel your heart pounding or racing.  Vomit.  Have diarrhea.  Have diabetes (diabetes mellitus) and you have trouble keeping your blood sugar (glucose) in your target range. Get help right away if you:  Have chest pain.  Have shortness of breath.  Have vomiting or diarrhea that lasts for more than 2 days.  Faint. Summary  Hypokalemia means that the amount of potassium in the blood is lower than normal.  This condition is diagnosed with a blood test.  Hypokalemia may be treated by taking potassium supplements, adjusting the medicines that you take, or eating more foods that are high in potassium.  If your potassium level is very low, you may need to get potassium through an IV and be monitored in the hospital. This information is not intended to replace advice given to you by your health care provider. Make sure you discuss any questions you have with your health care provider. Document Revised: 02/17/2018 Document Reviewed: 02/17/2018 Elsevier Patient Education  2021 [employer]. Cesarean Delivery, Care After This sheet gives you information about how to care for yourself after your procedure. Your health care provider may also give you more  specific instructions. If you have problems or questions, contact your health care provider. What can I expect after the procedure? After the procedure, it is common to have:  A small amount of blood or clear fluid coming from the incision.  Some redness,  swelling, and pain in your incision area.  Some abdominal pain and soreness.  Vaginal bleeding (lochia). Even though you did not have a vaginal delivery, you will still have vaginal bleeding and discharge.  Pelvic cramps.  Fatigue. You may have pain, swelling, and discomfort in the tissue between your vagina and your anus (perineum) if:  Your C-section was unplanned, and you were allowed to labor and push.  An incision was made in the area (episiotomy) or the tissue tore during attempted vaginal delivery. Follow these instructions at home: Incision care  Follow instructions from your health care provider about how to take care of your incision. Make sure you: ? Wash your hands with soap and water before you change your bandage (dressing). If soap and water are not available, use hand sanitizer. ? If you have a dressing, change it or remove it as told by your health care provider. ? Leave stitches (sutures), skin staples, skin glue, or adhesive strips in place. These skin closures may need to stay in place for 2 weeks or longer. If adhesive strip edges start to loosen and curl up, you may trim the loose edges. Do not remove adhesive strips completely unless your health care provider tells you to do that.  Check your incision area every day for signs of infection. Check for: ? More redness, swelling, or pain. ? More fluid or blood. ? Warmth. ? Pus or a bad smell.  Do not take baths, swim, or use a hot tub until your health care provider says it's okay. Ask your health care provider if you can take showers.  When you cough or sneeze, hug a pillow. This helps with pain and decreases the chance of your incision opening up (dehiscing). Do this until your incision heals.   Medicines  Take over-the-counter and prescription medicines only as told by your health care provider.  If you were prescribed an antibiotic medicine, take it as told by your health care provider. Do not stop taking the  antibiotic even if you start to feel better.  Do not drive or use heavy machinery while taking prescription pain medicine. Lifestyle  Do not drink alcohol. This is especially important if you are breastfeeding or taking pain medicine.  Do not use any products that contain nicotine or tobacco, such as cigarettes, e-cigarettes, and chewing tobacco. If you need help quitting, ask your health care provider. Eating and drinking  Drink at least 8 eight-ounce glasses of water every day unless told not to by your health care provider. If you breastfeed, you may need to drink even more water.  Eat high-fiber foods every day. These foods may help prevent or relieve constipation. High-fiber foods include: ? Whole grain cereals and breads. ? Brown rice. ? Beans. ? Fresh fruits and vegetables. Activity  If possible, have someone help you care for your baby and help with household activities for at least a few days after you leave the hospital.  Return to your normal activities as told by your health care provider. Ask your health care provider what activities are safe for you.  Rest as much as possible. Try to rest or take a nap while your baby is sleeping.  Do not lift anything that is  heavier than 10 lbs (4.5 kg), or the limit that you were told, until your health care provider says that it is safe.  Talk with your health care provider about when you can engage in sexual activity. This may depend on your: ? Risk of infection. ? How fast you heal. ? Comfort and desire to engage in sexual activity.   General instructions  Do not use tampons or douches until your health care provider approves.  Wear loose, comfortable clothing and a supportive and well-fitting bra.  Keep your perineum clean and dry. Wipe from front to back when you use the toilet.  If you pass a blood clot, save it and call your health care provider to discuss. Do not flush blood clots down the toilet before you get  instructions from your health care provider.  Keep all follow-up visits for you and your baby as told by your health care provider. This is important. Contact a health care provider if:  You have: ? A fever. ? Bad-smelling vaginal discharge. ? Pus or a bad smell coming from your incision. ? Difficulty or pain when urinating. ? A sudden increase or decrease in the frequency of your bowel movements. ? More redness, swelling, or pain around your incision. ? More fluid or blood coming from your incision. ? A rash. ? Nausea. ? Little or no interest in activities you used to enjoy. ? Questions about caring for yourself or your baby.  Your incision feels warm to the touch.  Your breasts turn red or become painful or hard.  You feel unusually sad or worried.  You vomit.  You pass a blood clot from your vagina.  You urinate more than usual.  You are dizzy or light-headed. Get help right away if:  You have: ? Pain that does not go away or get better with medicine. ? Chest pain. ? Difficulty breathing. ? Blurred vision or spots in your vision. ? Thoughts about hurting yourself or your baby. ? New pain in your abdomen or in one of your legs. ? A severe headache.  You faint.  You bleed from your vagina so much that you fill more than one sanitary pad in one hour. Bleeding should not be heavier than your heaviest period. Summary  After the procedure, it is common to have pain at your incision site, abdominal cramping, and slight bleeding from your vagina.  Check your incision area every day for signs of infection.  Tell your health care provider about any unusual symptoms.  Keep all follow-up visits for you and your baby as told by your health care provider. This information is not intended to replace advice given to you by your health care provider. Make sure you discuss any questions you have with your health care provider. Document Revised: 01/13/2018 Document Reviewed:  01/13/2018 Elsevier Patient Education  2021 [employer].

## 2020-11-06 ENCOUNTER — Other Ambulatory Visit (HOSPITAL_COMMUNITY): Payer: Self-pay

## 2020-11-06 ENCOUNTER — Other Ambulatory Visit: Payer: Medicaid Other

## 2020-11-06 ENCOUNTER — Ambulatory Visit: Payer: Self-pay

## 2020-11-06 ENCOUNTER — Ambulatory Visit: Payer: Medicaid Other

## 2020-11-06 LAB — GLUCOSE, CAPILLARY: Glucose-Capillary: 84 mg/dL (ref 70–99)

## 2020-11-06 LAB — SURGICAL PATHOLOGY

## 2020-11-06 MED ORDER — OXYCODONE HCL 5 MG PO TABS: 5.0000 mg | ORAL_TABLET | ORAL | 0 refills | Status: DC | PRN

## 2020-11-06 MED ORDER — IBUPROFEN 600 MG PO TABS
600.0000 mg | ORAL_TABLET | Freq: Four times a day (QID) | ORAL | 0 refills | Status: DC
  Filled 2020-11-06: qty 30, 8d supply, fill #0

## 2020-11-06 MED ORDER — POTASSIUM CHLORIDE CRYS ER 20 MEQ PO TBCR
40.0000 meq | EXTENDED_RELEASE_TABLET | Freq: Every day | ORAL | 1 refills | Status: DC
  Filled 2020-11-06: qty 14, 7d supply, fill #0

## 2020-11-06 MED ORDER — NIFEDIPINE ER OSMOTIC RELEASE 30 MG PO TB24
30.0000 mg | ORAL_TABLET | Freq: Every day | ORAL | Status: DC
  Administered 2020-11-06: 30 mg via ORAL
  Filled 2020-11-06: qty 1

## 2020-11-06 MED ORDER — NIFEDIPINE ER 30 MG PO TB24
30.0000 mg | ORAL_TABLET | Freq: Every day | ORAL | 1 refills | Status: DC
  Filled 2020-11-06: qty 30, 30d supply, fill #0

## 2020-11-06 MED ORDER — MAGNESIUM OXIDE 400 MG PO TABS
400.0000 mg | ORAL_TABLET | Freq: Every day | ORAL | 0 refills | Status: DC
  Filled 2020-11-06: qty 20, 20d supply, fill #0

## 2020-11-06 MED ORDER — OXYCODONE HCL 5 MG PO TABS
5.0000 mg | ORAL_TABLET | ORAL | 0 refills | Status: DC | PRN
  Filled 2020-11-06: qty 20, 4d supply, fill #0

## 2020-11-06 NOTE — Lactation Note (Signed)
This note was copied from a baby's chart. Lactation Consultation Note  Patient Name: Casey Meyer ALPFX'T Date: 11/06/2020   Age:39 hours   Mother not in room at this time. Family/friend stated mother taking a break.  Baby on phototherapy.  Lactation to follow up later today.    Hardie Pulley RN IBCLC 11/06/2020, 1:16 PM

## 2020-11-06 NOTE — Discharge Summary (Signed)
Postpartum Discharge Summary  Date of Service updated4/18/22     Patient Name: Casey Meyer DOB: [birth year] MRN: [id number]  Date of admission: 11/01/2020 Delivery date:11/03/2020  Delivering provider: Janyth Pupa  Date of discharge: 11/06/2020  Admitting diagnosis: Gestational hypertension, third trimester [O13.3] Intrauterine pregnancy: [redacted]w[redacted]d     Secondary diagnosis:  Active Problems:   Supervision of high risk pregnancy, antepartum   AMA (advanced maternal age) multigravida 35+   History of COVID-19   GDM (gestational diabetes mellitus)   LGA (large for gestational age) fetus affecting management of mother   Preeclampsia without severe features Chorioamnionitis   Hypokalemia  Additional problems: Hypomagnesiumemia     Discharge diagnosis: Term Pregnancy Delivered, Preeclampsia (mild) and GDM A2  Chorioamnionitis                                          Post partum procedures: none Augmentation: AROM, Pitocin and Cytotec Complications: [id number]  Hospital course: Induction of Labor With Cesarean Section   40 y.o. yo [id number] at [redacted]w[redacted]d was admitted to the hospital 11/01/2020 for induction of labor. Patient had a labor course significant for failure to progress.  Also noted to be febrile, started on Amp/Gent for suspected intra-amniotic infection. The patient went for cesarean section due to Arrest of Dilation. Delivery details are as follows: Membrane Rupture Time/Date: 9:45 AM ,11/02/2020   Delivery Method:C-Section, Low Transverse  Details of operation can be found in separate operative Note.  Patient had an uncomplicated postpartum course. Her K+/Mg+ were replaced and in near normal range. However, her BP was 140's/80's this am, so she will go home on Procardia. She is ambulating, tolerating a regular diet, passing flatus, and urinating well.  Patient is discharged home in stable condition on 11/06/20.      Newborn Data: Birth date:11/03/2020  Birth time:10:08 AM   Gender:Female  Living status:Living  Apgars:8 ,9  Weight:3.416 kg                                 Magnesium Sulfate received: No BMZ received: No Rhophylac:No MMR:N/A T-DaP: offered postpartum Flu: No Transfusion:No  Physical exam  Vitals:   11/04/20 2050 11/05/20 0500 11/05/20 2011 11/06/20 0519  BP: 137/79 136/81 (!) 141/83 (!) 141/81  Pulse:  88 79 78  Resp: $Remo'18 18 18 16  'JnkWO$ Temp: 98.2 F (36.8 C) 98 F (36.7 C)  97.6 F (36.4 C)  TempSrc: Oral Oral  Oral  SpO2: 98% 100% 98% 98%  Weight:      Height:       General: alert, cooperative and no distress Lochia: appropriate Uterine Fundus: firm Incision: Dressing is clean, dry, and intact DVT Evaluation: No evidence of DVT seen on physical exam. Labs: Lab Results  Component Value Date   WBC 16.6 (H) 11/04/2020   HGB 7.2 (L) 11/04/2020   HCT 21.2 (L) 11/04/2020   MCV 95.1 11/04/2020   PLT 178 11/04/2020   CMP Latest Ref Rng & Units 11/05/2020  Glucose 70 - 99 mg/dL 78  BUN 6 - 20 mg/dL <5(L)  Creatinine 0.44 - 1.00 mg/dL 0.64  Sodium 135 - 145 mmol/L 142  Potassium 3.5 - 5.1 mmol/L 3.0(L)  Chloride 98 - 111 mmol/L 105  CO2 22 - 32 mmol/L 30  Calcium 8.9 - 10.3 mg/dL 8.6(L)  Total  Protein 6.5 - 8.1 g/dL -  Total Bilirubin 0.3 - 1.2 mg/dL -  Alkaline Phos 38 - 126 U/L -  AST 15 - 41 U/L -  ALT 0 - 44 U/L -   Edinburgh Score: Edinburgh Postnatal Depression Scale Screening Tool 11/03/2020  I have been able to laugh and see the funny side of things. 0  I have looked forward with enjoyment to things. 0  I have blamed myself unnecessarily when things went wrong. 2  I have been anxious or worried for no good reason. 3  I have felt scared or panicky for no good reason. 2  Things have been getting on top of me. 0  I have been so unhappy that I have had difficulty sleeping. 2  I have felt sad or miserable. 1  I have been so unhappy that I have been crying. 1  The thought of harming myself has occurred to me. 0   Edinburgh Postnatal Depression Scale Total 11     After visit meds:  Allergies as of 11/06/2020       Reactions   Bee Venom Swelling        Medication List     STOP taking these medications    Accu-Chek Guide test strip Generic drug: glucose blood   Accu-Chek Guide w/Device Kit   Accu-Chek Softclix Lancets lancets   acetaminophen 500 MG tablet Commonly known as: TYLENOL   diphenhydramine-acetaminophen 25-500 MG Tabs tablet Commonly known as: TYLENOL PM   metFORMIN 500 MG tablet Commonly known as: GLUCOPHAGE   prenatal multivitamin Tabs tablet   ROLAIDS PO       TAKE these medications    ferrous sulfate 325 (65 FE) MG tablet Commonly known as: FerrouSul Take 1 tablet (325 mg total) by mouth every other day.   ibuprofen 600 MG tablet Commonly known as: ADVIL Take 1 tablet (600 mg total) by mouth every 6 (six) hours.   Magnesium Oxide 400 MG Caps Take 1 capsule (400 mg total) by mouth daily.   NIFEdipine 30 MG 24 hr tablet Commonly known as: ADALAT CC Take 1 tablet (30 mg total) by mouth daily.   oxyCODONE 5 MG immediate release tablet Commonly known as: Oxy IR/ROXICODONE Take 1 tablet (5 mg total) by mouth every 4 (four) hours as needed for severe pain.   potassium chloride SA 20 MEQ tablet Commonly known as: KLOR-CON Take 2 tablets (40 mEq total) by mouth daily.         Discharge home in stable condition Infant Feeding: Breast Infant Disposition:home with mother Discharge instruction: per After Visit Summary and Postpartum booklet. Activity: Advance as tolerated. Pelvic rest for 6 weeks.  Diet: routine diet Future Appointments:1 week for Incision and BP check, repeat blood tests, then 4 wks Follow up Visit:  I sent a message for them to call you   [place] [facility]. Schedule an appointment as soon as possible for a visit on 11/12/2020.   Specialty: Obstetrics and Gynecology Contact  information: [address] [place] [phone]                 Please schedule this patient for a In person postpartum visit in 1 week with the following provider: Any provider. Additional Postpartum F/U:Incision check 1 week   Glucola in 4 weeks High risk pregnancy complicated by: GDM, HTN and hypokalemia Delivery mode:  C-Section, Low Transverse  Anticipated Birth  Control:  BTL with C/S   11/06/2020 Christin Fudge, CNM

## 2020-11-06 NOTE — Progress Notes (Signed)
Instructed patient to install Bab[employer] app. Patient states she has not currently been using the app at home prior to delivery and does not have the blood pressure kit. No blood pressure kits available on the unit to give out. Called Dr. Berniece Andreas, who stated that if the patient has not been set up with babyscripts in the office prior to hospital admission, then we will not start here prior to discharge. Patient still needs to monitor blood pressures at home; however, not by using the app. Dr. Berniece Andreas instructed to cancel babyscrips app order. Maxwell Caul, Leretha Dykes [facility]

## 2020-11-08 ENCOUNTER — Encounter: Payer: Medicaid Other | Admitting: Obstetrics and Gynecology

## 2020-11-08 ENCOUNTER — Ambulatory Visit: Payer: Medicaid Other

## 2020-11-12 ENCOUNTER — Ambulatory Visit (INDEPENDENT_AMBULATORY_CARE_PROVIDER_SITE_OTHER): Payer: Medicaid Other | Admitting: *Deleted

## 2020-11-12 ENCOUNTER — Other Ambulatory Visit: Payer: Self-pay

## 2020-11-12 VITALS — BP 139/85 | HR 70

## 2020-11-12 DIAGNOSIS — O1493 Unspecified pre-eclampsia, third trimester: Secondary | ICD-10-CM

## 2020-11-12 NOTE — Progress Notes (Signed)
Patient was assessed and managed by nursing staff during this encounter. I have reviewed the chart and agree with the documentation and plan. I have also made any necessary editorial changes.  Jaynie Collins, MD 11/12/2020 11:32 AM

## 2020-11-12 NOTE — Progress Notes (Signed)
Subjective:  Casey Meyer is a 40 y.o. female here for BP and incision check.   Hypertension ROS: taking medications as instructed, no medication side effects noted, no TIA's, no chest pain on exertion, no dyspnea on exertion and no swelling of ankles.  Incision: healing well, no drainage or redness noted.    Objective:  BP 139/85   Pulse 70   Appearance alert, well appearing, and in no distress. General exam BP noted to be well controlled today in office.    Assessment:   Blood Pressure well controlled.   Plan:  Follow up as needed and or at postpartum visit. Marland Kitchen

## 2020-11-13 LAB — BASIC METABOLIC PANEL
BUN/Creatinine Ratio: 13 (ref 9–23)
BUN: 9 mg/dL (ref 6–20)
CO2: 19 mmol/L — ABNORMAL LOW (ref 20–29)
Calcium: 8.9 mg/dL (ref 8.7–10.2)
Chloride: 107 mmol/L — ABNORMAL HIGH (ref 96–106)
Creatinine, Ser: 0.69 mg/dL (ref 0.57–1.00)
Glucose: 94 mg/dL (ref 65–99)
Potassium: 4.4 mmol/L (ref 3.5–5.2)
Sodium: 143 mmol/L (ref 134–144)
eGFR: 114 mL/min/{1.73_m2} (ref >59)

## 2020-11-13 LAB — MAGNESIUM: Magnesium: 1.9 mg/dL (ref 1.6–2.3)

## 2020-11-19 ENCOUNTER — Telehealth: Payer: Self-pay

## 2020-11-19 NOTE — Telephone Encounter (Signed)
Casey Meyer - pt's husband called stating Casey Meyer has been experiencing chest pains, occasional sob, and pain at incision site. Advised pt to take bp > 148/110 Advised pt to go to [facility] to be evaluated. Dorinda Hill states they can not make it that far. Advised pt to report to [facility] if they can not make it to [place].

## 2020-11-22 ENCOUNTER — Encounter: Payer: Self-pay | Admitting: *Deleted

## 2020-11-22 ENCOUNTER — Encounter: Payer: Medicaid Other | Admitting: Obstetrics and Gynecology

## 2020-11-26 ENCOUNTER — Other Ambulatory Visit (INDEPENDENT_AMBULATORY_CARE_PROVIDER_SITE_OTHER): Payer: Self-pay

## 2020-12-06 ENCOUNTER — Ambulatory Visit (INDEPENDENT_AMBULATORY_CARE_PROVIDER_SITE_OTHER): Payer: Medicaid Other | Admitting: Obstetrics & Gynecology

## 2020-12-06 ENCOUNTER — Other Ambulatory Visit: Payer: Self-pay

## 2020-12-06 ENCOUNTER — Encounter: Payer: Self-pay | Admitting: Obstetrics & Gynecology

## 2020-12-06 DIAGNOSIS — O1495 Unspecified pre-eclampsia, complicating the puerperium: Secondary | ICD-10-CM | POA: Diagnosis not present

## 2020-12-06 DIAGNOSIS — O99345 Other mental disorders complicating the puerperium: Secondary | ICD-10-CM

## 2020-12-06 DIAGNOSIS — Z8632 Personal history of gestational diabetes: Secondary | ICD-10-CM

## 2020-12-06 DIAGNOSIS — O1494 Unspecified pre-eclampsia, complicating childbirth: Secondary | ICD-10-CM

## 2020-12-06 DIAGNOSIS — F53 Postpartum depression: Secondary | ICD-10-CM

## 2020-12-06 DIAGNOSIS — R399 Unspecified symptoms and signs involving the genitourinary system: Secondary | ICD-10-CM

## 2020-12-06 LAB — POCT URINALYSIS DIPSTICK

## 2020-12-06 MED ORDER — SERTRALINE HCL 50 MG PO TABS
50.0000 mg | ORAL_TABLET | Freq: Every day | ORAL | 12 refills | Status: AC
Start: 2020-12-06 — End: ?

## 2020-12-06 NOTE — Progress Notes (Signed)
Post Partum Visit Note  Casey Meyer is a 40 y.o. [phone] female who presents for a postpartum visit. She is 4 weeks postpartum following a primary cesarean section.  I have fully reviewed the prenatal and intrapartum course, patient had GDM and preeclampsia without severe features. The delivery was at 37.4 gestational weeks; cesarean section done due to arrest of dilation.  Anesthesia: epidural. Postpartum course has been uncomplicated. Baby is doing well. Baby is feeding by breast. Bleeding staining only. Bowel function is normal. Bladder function is having issues with pain with urination. Patient is not sexually active. Contraception method is tubal ligation. Postpartum depression screening: positive.  Reports some pain during urination occasionally, no back pain, no fevers, will check urine culture today and manage accordingly. Likely trauma from urinary catheter placement that is healing, patient does report more pain after delivery, has lessened in intensity and frequency over time.   The pregnancy intention screening data noted above was reviewed. Potential methods of contraception were discussed. The patient elected to proceed with Female Sterilization.    Edinburgh Postnatal Depression Scale - 12/06/20 0912      Edinburgh Postnatal Depression Scale:  In the Past 7 Days   I have been able to laugh and see the funny side of things. 1    I have looked forward with enjoyment to things. 1    I have blamed myself unnecessarily when things went wrong. 3    I have been anxious or worried for no good reason. 3    I have felt scared or panicky for no good reason. 3    Things have been getting on top of me. 2    I have been so unhappy that I have had difficulty sleeping. 2    I have felt sad or miserable. 2    I have been so unhappy that I have been crying. 1    The thought of harming myself has occurred to me. 0    Edinburgh Postnatal Depression Scale Total 18           Health  Maintenance Due  Topic Date Due  . COVID-19 Vaccine (1) Never done  . URINE MICROALBUMIN  Never done  . TETANUS/TDAP  Never done    The following portions of the patient's history were reviewed and updated as appropriate: allergies, current medications, past family history, past medical history, past social history, past surgical history and problem list.  Review of Systems Pertinent items noted in HPI and remainder of comprehensive ROS otherwise negative.  Objective:  BP 131/79   Pulse 79   Wt 162 lb (73.5 kg)   BMI 29.63 kg/m    General:  alert and no distress   Breasts:  normal  Lungs: clear to auscultation bilaterally  Heart:  regular rate and rhythm  Abdomen: soft, non-tender; bowel sounds normal; no masses,  no organomegaly   Wound well approximated incision, no concerning features  GU exam:  not indicated       Assessment:  1. Postpartum depression - sertraline (ZOLOFT) 50 MG tablet; Take 1 tablet (50 mg total) by mouth daily.  Dispense: 30 tablet; Refill: 12 - [facility] 2. Hypertension in pregnancy, preeclampsia, delivered 3. History of gestational diabetes - Glucose tolerance, 2 hours; Future 4. UTI symptoms - Urine Culture 5. Postpartum care following cesarean delivery  Plan:   Essential components of care per ACOG recommendations:  1.  Mood and well being: Patient with positive depression  screening today. Reviewed local resources for support.  - Patient tobacco use? No.   - hx of drug use? No.    2. Infant care and feeding:  -Patient currently breastmilk feeding? Yes. Discussed returning to work and pumping. Reviewed importance of draining breast regularly to support lactation.  -Social determinants of health (SDOH) reviewed in EPIC. No concerns.  3. Sexuality, contraception and birth spacing - Patient does not want a pregnancy in the next year.  Desired family size is 3 children.  - Reviewed forms of contraception in tiered  fashion. Patient desired bilateral tubal ligation today.    4. Sleep and fatigue -Encouraged family/partner/community support of 4 hrs of uninterrupted sleep to help with mood and fatigue. This is difficult to do as spouse works at night  5. Physical Recovery  - Discussed patients delivery  - Patient had a C-section failure to progress. - Patient has urinary incontinence? No.  - Patient is safe to resume physical and sexual activity  6.  Health Maintenance - HM due items addressed Yes - Last pap smear  Diagnosis  Date Value Ref Range Status  05/22/2020   Final   - Negative for intraepithelial lesion or malignancy (NILM)   Pap smear not done at today's visit.  -Breast Cancer screening indicated? No.   7. H/O GHTN and GDM - Normal BP, no meds - Will return for 2 hr GTT postpartum - PCP follow up  8. Postpartum depression -  Patient verbally consented to [facility] services about presenting concerns and psychiatric consultation as appropriate. Referral made -  Zoloft restarted at 50 mg daily, may need to increase dosage if needed.    Jaynie Collins, MD [facility] [employer], [facility]

## 2020-12-06 NOTE — Addendum Note (Signed)
Addended by: Scheryl Marten on: 12/06/2020 09:49 AM   Modules accepted: Orders

## 2020-12-08 LAB — URINE CULTURE: Organism ID, Bacteria: NO GROWTH

## 2020-12-11 ENCOUNTER — Encounter: Payer: Medicaid Other | Admitting: Licensed Clinical Social Worker

## 2020-12-19 ENCOUNTER — Other Ambulatory Visit: Payer: Medicaid Other

## 2021-09-03 ENCOUNTER — Emergency Department: Payer: Medicaid Other

## 2021-09-03 ENCOUNTER — Emergency Department
Admission: EM | Admit: 2021-09-03 | Discharge: 2021-09-03 | Disposition: A | Discharge status: Home / Self Care | Payer: Medicaid Other | Attending: Emergency Medicine | Admitting: Emergency Medicine

## 2021-09-03 ENCOUNTER — Other Ambulatory Visit: Payer: Self-pay

## 2021-09-03 ENCOUNTER — Encounter: Payer: Self-pay | Admitting: Emergency Medicine

## 2021-09-03 DIAGNOSIS — K047 Periapical abscess without sinus: Secondary | ICD-10-CM | POA: Diagnosis not present

## 2021-09-03 DIAGNOSIS — R000 Tachycardia, unspecified: Secondary | ICD-10-CM | POA: Insufficient documentation

## 2021-09-03 DIAGNOSIS — K0889 Other specified disorders of teeth and supporting structures: Secondary | ICD-10-CM | POA: Diagnosis present

## 2021-09-03 LAB — BASIC METABOLIC PANEL
Anion gap: 8 (ref 5–15)
BUN: 8 mg/dL (ref 6–20)
CO2: 23 mmol/L (ref 22–32)
Calcium: 9 mg/dL (ref 8.9–10.3)
Chloride: 107 mmol/L (ref 98–111)
Creatinine, Ser: 0.58 mg/dL (ref 0.44–1.00)
GFR, Estimated: >60 mL/min (ref >60)
Glucose, Bld: 110 mg/dL — ABNORMAL HIGH (ref 70–99)
Potassium: 2.7 mmol/L — CL (ref 3.5–5.1)
Sodium: 138 mmol/L (ref 135–145)

## 2021-09-03 LAB — TROPONIN I (HIGH SENSITIVITY): Troponin I (High Sensitivity): 4 ng/L (ref <18)

## 2021-09-03 LAB — CBC
HCT: 39.7 % (ref 36.0–46.0)
Hemoglobin: 13.4 g/dL (ref 12.0–15.0)
MCH: 29.7 pg (ref 26.0–34.0)
MCHC: 33.8 g/dL (ref 30.0–36.0)
MCV: 88 fL (ref 80.0–100.0)
Platelets: 252 10*3/uL (ref 150–400)
RBC: 4.51 MIL/uL (ref 3.87–5.11)
RDW: 13.2 % (ref 11.5–15.5)
WBC: 19.1 10*3/uL — ABNORMAL HIGH (ref 4.0–10.5)
nRBC: 0 % (ref 0.0–0.2)

## 2021-09-03 MED ORDER — AMOXICILLIN 875 MG PO TABS
875.0000 mg | ORAL_TABLET | Freq: Two times a day (BID) | ORAL | 0 refills | Status: AC
Start: 2021-09-03 — End: ?

## 2021-09-03 MED ORDER — OXYCODONE-ACETAMINOPHEN 5-325 MG PO TABS: 1.0000 | ORAL_TABLET | Freq: Four times a day (QID) | ORAL | 0 refills | Status: AC | PRN

## 2021-09-03 MED ORDER — KETOROLAC TROMETHAMINE 10 MG PO TABS
10.0000 mg | ORAL_TABLET | Freq: Four times a day (QID) | ORAL | 0 refills | Status: AC | PRN
Start: 2021-09-03 — End: ?

## 2021-09-03 MED ORDER — AMOXICILLIN 500 MG PO CAPS
500.0000 mg | ORAL_CAPSULE | ORAL | Status: AC
  Administered 2021-09-03: 500 mg via ORAL
  Filled 2021-09-03: qty 1

## 2021-09-03 MED ORDER — OXYCODONE-ACETAMINOPHEN 5-325 MG PO TABS
1.0000 | ORAL_TABLET | Freq: Once | ORAL | Status: AC
  Administered 2021-09-03: 1 via ORAL
  Filled 2021-09-03: qty 1

## 2021-09-03 MED ORDER — KETOROLAC TROMETHAMINE 30 MG/ML IJ SOLN
15.0000 mg | INTRAMUSCULAR | Status: AC
  Administered 2021-09-03: 15 mg via INTRAMUSCULAR
  Filled 2021-09-03: qty 1

## 2021-09-03 NOTE — ED Provider Notes (Signed)
[facility] Provider Note    Event Date/Time   First MD Initiated Contact with Patient 09/03/21 1751     (approximate)   History   Dental Pain   HPI  Casey Meyer is a 40 y.o. female with a past history of bipolar disorder who comes ED complaining of right upper jaw pain for the past week with swelling noted around her rearmost remaining molar.  She notes that she has widespread dental decay with many teeth that are decayed down to the gingival surface.  Over the past week she has been having worsening pain in the right upper jaw.  No difficulty breathing or swallowing.  No fever.  No neck pain or swelling.  No facial swelling.  She went to [facility] and she was sent to the ED for evaluation.     Physical Exam   Triage Vital Signs: ED Triage Vitals  Enc Vitals Group     BP 09/03/21 1446 (!) 144/82     Pulse Rate 09/03/21 1446 (!) 102     Resp 09/03/21 1446 16     Temp 09/03/21 1446 99 F (37.2 C)     Temp Source 09/03/21 1446 Oral     SpO2 09/03/21 1446 95 %     Weight 09/03/21 1442 162 lb 0.6 oz (73.5 kg)     Height 09/03/21 1442 5\' 2"  (1.575 m)     Head Circumference --      Peak Flow --      Pain Score 09/03/21 1442 9     Pain Loc --      Pain Edu? --      Excl. in GC? --     Most recent vital signs: Vitals:   09/03/21 1446 09/03/21 1748  BP: (!) 144/82 140/74  Pulse: (!) 102 (!) 102  Resp: 16 17  Temp: 99 F (37.2 C) 99 F (37.2 C)  SpO2: 95% 96%     General: Awake, no distress.  CV:  Good peripheral perfusion.  Tachycardia heart rate 100 Resp:  Normal effort.  Clear to auscultation bilaterally Abd:  No distention.  Soft and nontender Other:  No neck swelling or tenderness, no lymphadenopathy.  Thyroid nonpalpable.  Oropharynx is clear.  Poor dentition with widespread dental decay.  There is localized gingival swelling around her rearmost remaining upper right molar, which is extensively decayed.  No fluctuance or purulent  drainage.   ED Results / Procedures / Treatments   Labs (all labs ordered are listed, but only abnormal results are displayed) Labs Reviewed  BASIC METABOLIC PANEL - Abnormal; Notable for the following components:      Result Value   Potassium 2.7 (*)    Glucose, Bld 110 (*)    All other components within normal limits  CBC - Abnormal; Notable for the following components:   WBC 19.1 (*)    All other components within normal limits  POC URINE PREG, ED  TROPONIN I (HIGH SENSITIVITY)  TROPONIN I (HIGH SENSITIVITY)     EKG  Interpreted by me Sinus tachycardia rate 108.  Normal axis, mildly prolonged QTc of 520 ms.  Normal QRS ST segments and T waves.   RADIOLOGY Chest reviewed and interpreted by me, appears unremarkable.  Radiology report reviewed    PROCEDURES:  Critical Care performed: No  Procedures   MEDICATIONS ORDERED IN ED: Medications  oxyCODONE-acetaminophen (PERCOCET/ROXICET) 5-325 MG per tablet 1 tablet (has no administration in time range)  amoxicillin (AMOXIL) capsule  500 mg (has no administration in time range)  ketorolac (TORADOL) 30 MG/ML injection 15 mg (has no administration in time range)     IMPRESSION / MDM / ASSESSMENT AND PLAN / ED COURSE  I reviewed the triage vital signs and the nursing notes.                              Differential diagnosis includes, but is not limited to, patient presents with dental pain with evidence of odontogenic infection.  No drainable abscess.  No signs of secondary space infection such as facial cellulitis or submandibular swelling.  No airway threat.  Not septic.  We will give intramuscular Toradol and oral Percocet for pain relief.  Will start on amoxicillin and instruct to follow-up with dental clinic.         FINAL CLINICAL IMPRESSION(S) / ED DIAGNOSES   Final diagnoses:  Dental infection     Rx / DC Orders   ED Discharge Orders          Ordered    amoxicillin (AMOXIL) 875 MG tablet  2  times daily        09/03/21 1828    ketorolac (TORADOL) 10 MG tablet  Every 6 hours PRN        09/03/21 1828    oxyCODONE-acetaminophen (PERCOCET) 5-325 MG tablet  Every 6 hours PRN        09/03/21 1828             Note:  This document was prepared using Dragon voice recognition software and may include unintentional dictation errors.   Sharman Cheek, MD 09/03/21 [phone]

## 2021-09-03 NOTE — ED Triage Notes (Signed)
Sent to ED from [facility]. Patient presented for c/o right upper jaw dental pain / abscess x 1 week.  Worsening over the week.  Also c/o cp x 1 week. No SOB.  HR at [facility] was 120.

## 2021-09-03 NOTE — ED Notes (Signed)
Pt states she wants to leave and does not want to wait for provider. John RN went over discharge instructions with pt. Pt understands all information.

## 2023-06-03 ENCOUNTER — Ambulatory Visit: Payer: Self-pay | Admitting: *Deleted

## 2023-06-03 NOTE — Telephone Encounter (Signed)
  Chief Complaint: back pain- radiating into buttocks and legs Symptoms: severe , chronic back pain- mid back down Frequency: over 1 year Pertinent Negatives: Patient denies fever, abdomen pain, burning with urination, blood in urine Disposition: [] ED /[x] Urgent Care (no appt availability in office) / [] Appointment(In office/virtual)/ []  [facility]/ [] Home Care/ [] Refused Recommended Disposition /[] [facility]/ []  Follow-up with PCP Additional Notes: Patient has NP appointment- but uncontrolled back pain. Patient advised UC until she can be seen in office.

## 2023-06-03 NOTE — Telephone Encounter (Signed)
Reason for Disposition  [1] Pain radiates into the thigh or further down the leg AND [2] both legs  Answer Assessment - Initial Assessment Questions 1. ONSET: "When did the pain begin?"      Progressing over 1 year- chronic 2. LOCATION: "Where does it hurt?" (upper, mid or lower back)     Mid back down into both legs 3. SEVERITY: "How bad is the pain?"  (e.g., Scale 1-10; mild, moderate, or severe)   - MILD (1-3): Doesn't interfere with normal activities.    - MODERATE (4-7): Interferes with normal activities or awakens from sleep.    - SEVERE (8-10): Excruciating pain, unable to do any normal activities.      Severe-8/10 4. PATTERN: "Is the pain constant?" (e.g., yes, no; constant, intermittent)      Constant- uses back brace 5. RADIATION: "Does the pain shoot into your legs or somewhere else?"     Into buttocks and legs 6. CAUSE:  "What do you think is causing the back pain?"      Unknown- patient thinks may be related to epidural 7. BACK OVERUSE:  "Any recent lifting of heavy objects, strenuous work or exercise?"     Patient does have physical work 8. MEDICINES: "What have you taken so far for the pain?" (e.g., nothing, acetaminophen, NSAIDS)     Excedrin extra strength 9. NEUROLOGIC SYMPTOMS: "Do you have any weakness, numbness, or problems with bowel/bladder control?"     Has ladder problems, bowel issues 10. OTHER SYMPTOMS: "Do you have any other symptoms?" (e.g., fever, abdomen pain, burning with urination, blood in urine)       Proteinuria- 2 years ago  Protocols used: Back Pain-A-AH

## 2023-07-21 ENCOUNTER — Ambulatory Visit: Payer: Medicaid Other | Admitting: Family Medicine

## 2023-07-21 DIAGNOSIS — Z87898 Personal history of other specified conditions: Secondary | ICD-10-CM | POA: Insufficient documentation

## 2023-07-21 DIAGNOSIS — L301 Dyshidrosis [pompholyx]: Secondary | ICD-10-CM | POA: Insufficient documentation

## 2023-07-21 DIAGNOSIS — Z8632 Personal history of gestational diabetes: Secondary | ICD-10-CM | POA: Insufficient documentation

## 2023-07-21 NOTE — Assessment & Plan Note (Deleted)
09/03/2021 - sinus tachycardia, prolonged QTc of 520 ms

## 2023-07-21 NOTE — Progress Notes (Deleted)
 New patient visit   Patient: Casey Meyer   DOB: September 19, 1980   42 y.o. Female  MRN: [id number] Visit Date: 07/21/2023  Today's healthcare provider: LAURAINE LOISE BUOY, DO   No chief complaint on file.  Subjective    Casey Meyer is a 42 y.o. female who presents today as a new patient to establish care.  HPI  [id number].  LMP? Breast-feeding?  Unlikely but ask Last Pap smear 05/22/2020 - NILM, HPV negative  History of gestational hypertension and gestational diabetes with normal blood pressure following delivery.  2-hour GTT postpartum was planned but appears to not have been done due to missed follow-up appointment.  Vaccines? Recent treatment with Robaxin 3 times daily x 10 days and methylprednisolone Dosepak from fasting on 06/10/2023 Previously given clobetasol 0.05% cream for dyshidrotic eczema flare    ***  Past Medical History:  Diagnosis Date   Anemia    Bipolar 1 disorder (HCC)    Gestational diabetes    History of COVID-19 05/22/2020   Late sept 2021   Preeclampsia 11/01/2020   Past Surgical History:  Procedure Laterality Date   CESAREAN SECTION WITH BILATERAL TUBAL LIGATION N/A 11/03/2020   Procedure: CESAREAN SECTION WITH BILATERAL TUBAL LIGATION;  Surgeon: Ozan, Jennifer, DO;  Location: [facility] LD ORS;  Service: Obstetrics;  Laterality: N/A;   DILATION AND CURETTAGE OF UTERUS     NOSE SURGERY     Family Status  Relation Name Status   Mother  Deceased   Father  Alive   PGM  Deceased  No partnership data on file   Family History  Problem Relation Age of Onset   Cancer Father    Diabetes Father    Diabetes Paternal Grandmother    Social History   Socioeconomic History   Marital status: Married    Spouse name: Not on file   Number of children: Not on file   Years of education: Not on file   Highest education level: Not on file  Occupational History   Not on file  Tobacco Use   Smoking status: Former    Current packs/day: 0.00    Average packs/day: 0.3  packs/day for 24.0 years (6.0 ttl pk-yrs)    Types: Cigarettes    Start date: 08/21/1996    Quit date: 08/21/2020    Years since quitting: 2.9   Smokeless tobacco: Never  Vaping Use   Vaping status: Never Used  Substance and Sexual Activity   Alcohol use: No   Drug use: No   Sexual activity: Yes    Birth control/protection: Injection  Other Topics Concern   Not on file  Social History Narrative   Not on file   Social Drivers of Health   Financial Resource Strain: Not on file  Food Insecurity: Not on file  Transportation Needs: Not on file  Physical Activity: Not on file  Stress: Not on file  Social Connections: Not on file   Outpatient Medications Prior to Visit  Medication Sig   amoxicillin  (AMOXIL ) 875 MG tablet Take 1 tablet (875 mg total) by mouth 2 (two) times daily.   ketorolac  (TORADOL ) 10 MG tablet Take 1 tablet (10 mg total) by mouth every 6 (six) hours as needed for moderate pain.   Prenatal Vit-Fe Fumarate-FA (MULTIVITAMIN-PRENATAL) 27-0.8 MG TABS tablet Take 1 tablet by mouth daily at 12 noon.   sertraline  (ZOLOFT ) 50 MG tablet Take 1 tablet (50 mg total) by mouth daily.   No facility-administered medications prior to visit.  Allergies  Allergen Reactions   Bee Venom Swelling    There is no immunization history for the selected administration types on file for this patient.  Health Maintenance  Topic Date Due   DTaP/Tdap/Td (1 - Tdap) Never done   INFLUENZA VACCINE  Never done   COVID-19 Vaccine (1 - 2024-25 season) Never done   Cervical Cancer Screening (HPV/Pap Cotest)  05/22/2025   Hepatitis C Screening  Completed   HIV Screening  Completed   HPV VACCINES  Aged Out    Patient Care Team: Pcp, No as PCP - General  Review of Systems  {Insert previous labs (optional):23779} {See past labs  Heme  Chem  Endocrine  Serology  Results Review (optional):1}   Objective    There were no vitals taken for this visit. {Insert last BP/Wt  (optional):23777}{See vitals history (optional):1}   Physical Exam   Depression Screen    10/04/2020    3:40 PM  PHQ 2/9 Scores  PHQ - 2 Score 0   No results found for any visits on 07/21/23.  Assessment & Plan     There are no diagnoses linked to this encounter.   ***  No follow-ups on file.     I discussed the assessment and treatment plan with the patient  The patient was provided an opportunity to ask questions and all were answered. The patient agreed with the plan and demonstrated an understanding of the instructions.   The patient was advised to call back or seek an in-person evaluation if the symptoms worsen or if the condition fails to improve as anticipated.    LAURAINE LOISE BUOY, DO  [facility] [phone] (phone) [phone] (fax)  [facility]

## 2024-06-24 ENCOUNTER — Other Ambulatory Visit: Payer: Self-pay

## 2024-06-24 ENCOUNTER — Emergency Department
Admission: EM | Admit: 2024-06-24 | Discharge: 2024-06-24 | Disposition: A | Discharge status: Home / Self Care | Payer: Self-pay | Attending: Emergency Medicine | Admitting: Emergency Medicine

## 2024-06-24 DIAGNOSIS — K029 Dental caries, unspecified: Secondary | ICD-10-CM

## 2024-06-24 DIAGNOSIS — K0889 Other specified disorders of teeth and supporting structures: Secondary | ICD-10-CM | POA: Insufficient documentation

## 2024-06-24 MED ORDER — IBUPROFEN 600 MG PO TABS
600.0000 mg | ORAL_TABLET | Freq: Once | ORAL | Status: AC
  Administered 2024-06-24: 600 mg via ORAL
  Filled 2024-06-24: qty 1

## 2024-06-24 MED ORDER — CHLORHEXIDINE GLUCONATE 0.12 % MT SOLN: 15.0000 mL | Freq: Two times a day (BID) | OROMUCOSAL | 0 refills | Status: AC

## 2024-06-24 MED ORDER — AMOXICILLIN-POT CLAVULANATE 875-125 MG PO TABS: 1.0000 | ORAL_TABLET | Freq: Two times a day (BID) | ORAL | 0 refills | Status: AC

## 2024-06-24 MED ORDER — ACETAMINOPHEN 325 MG PO TABS
650.0000 mg | ORAL_TABLET | Freq: Once | ORAL | Status: AC
  Administered 2024-06-24: 650 mg via ORAL
  Filled 2024-06-24: qty 2

## 2024-06-24 NOTE — ED Triage Notes (Signed)
 Pt comes in via pov with complaints of chronic dental pain. Pt states that she has dealt with the pain for months, but felt the pain got worse yesterday. Pt complains pf left lower tooth pain 8/10 at this time.

## 2024-06-24 NOTE — ED Provider Notes (Signed)
 [facility] Provider Note    Event Date/Time   First MD Initiated Contact with Patient 06/24/24 1046     (approximate)   History   Dental Pain   HPI  Casey Meyer is a 43 y.o. female who presents with a chief complaint of dental pain.  She reports that she has pain to her left lower molars.  She reports that this has been a problem for many months.  She does not have a dentist.  No fevers or chills.  No facial or neck swelling or erythema.  No difficulty breathing or swallowing.  No difficulty opening her mouth.  Patient Active Problem List   Diagnosis Date Noted   Dyshidrotic eczema 07/21/2023   History of gestational diabetes 07/21/2023   H/O prolonged Q-T interval on ECG 07/21/2023          Physical Exam   Triage Vital Signs: ED Triage Vitals  Encounter Vitals Group     BP 06/24/24 1039 138/85     Girls Systolic BP Percentile --      Girls Diastolic BP Percentile --      Boys Systolic BP Percentile --      Boys Diastolic BP Percentile --      Pulse Rate 06/24/24 1039 (!) 109     Resp 06/24/24 1039 18     Temp 06/24/24 1039 98.7 F (37.1 C)     Temp src --      SpO2 06/24/24 1039 100 %     Weight 06/24/24 1037 150 lb (68 kg)     Height 06/24/24 1037 5' 2 (1.575 m)     Head Circumference --      Peak Flow --      Pain Score 06/24/24 1037 8     Pain Loc --      Pain Education --      Exclude from Growth Chart --     Most recent vital signs: Vitals:   06/24/24 1039  BP: 138/85  Pulse: (!) 109  Resp: 18  Temp: 98.7 F (37.1 C)  SpO2: 100%    Physical Exam Vitals and nursing note reviewed.  Constitutional:      General: Awake and alert. No acute distress.    Appearance: Normal appearance. The patient is normal weight.  HENT:     Head: Normocephalic and atraumatic.     Mouth: Mucous membranes are moist.  Severe dental decay noted with multiple missing teeth.  Obvious dental caries.  No gingival fluctuance or erythema.  No  sublingual swelling.  No trismus, no drooling.  No facial or neck swelling or erythema.  Normal voice. Eyes:     General: PERRL. Normal EOMs        Right eye: No discharge.        Left eye: No discharge.     Conjunctiva/sclera: Conjunctivae normal.  Cardiovascular:     Rate and Rhythm: Normal rate and regular rhythm.     Pulses: Normal pulses.  Pulmonary:     Effort: Pulmonary effort is normal. No respiratory distress.     Breath sounds: Normal breath sounds.  Abdominal:     Abdomen is soft. There is no abdominal tenderness.  Musculoskeletal:        General: No swelling. Normal range of motion.     Cervical back: Normal range of motion and neck supple.  Skin:    General: Skin is warm and dry.     Capillary Refill: Capillary refill  takes less than 2 seconds.     Findings: No rash.  Neurological:     Mental Status: The patient is awake and alert.      ED Results / Procedures / Treatments   Labs (all labs ordered are listed, but only abnormal results are displayed) Labs Reviewed - No data to display   EKG     RADIOLOGY     PROCEDURES:  Critical Care performed:   Procedures   MEDICATIONS ORDERED IN ED: Medications  ibuprofen  (ADVIL ) tablet 600 mg (600 mg Oral Given 06/24/24 1118)  acetaminophen  (TYLENOL ) tablet 650 mg (650 mg Oral Given 06/24/24 1118)     IMPRESSION / MDM / ASSESSMENT AND PLAN / ED COURSE  I reviewed the triage vital signs and the nursing notes.   Differential diagnosis includes, but is not limited to, pulpitis, dental caries, dental decay, abscess.  I reviewed the patient's chart.  Patient was seen in February 2023 with the same complaint.  Patient is awake and alert, afebrile and nontoxic in appearance.  Patient has diffusely poor dentition and obvious dental decay, I suspect pain is from dental caries vs pulpitits. No gingival swelling or fluctuance concerning for gingival abscess.  No trismus, nuchal rigidity, neck pain, hot potato  voice, uvular deviation or malocclusion to suggest deep space infection. No sublingual swelling concerning for Ludwig's angina.  Patient was started on antibiotics and chlorhexidine  mouth rinse.  Patient was treated symptomatically in the emergency department. Discussed care plan, return precautions, and advised close outpatient follow-up with dentist. Patient agrees with plan of care.   Patient's presentation is most consistent with exacerbation of chronic illness.   FINAL CLINICAL IMPRESSION(S) / ED DIAGNOSES   Final diagnoses:  Pain due to dental caries     Rx / DC Orders   ED Discharge Orders          Ordered    amoxicillin -clavulanate (AUGMENTIN ) 875-125 MG tablet  2 times daily        06/24/24 1054    chlorhexidine  (PERIDEX ) 0.12 % solution  2 times daily        06/24/24 1054             Note:  This document was prepared using Dragon voice recognition software and may include unintentional dictation errors.   Kaion Tisdale E, PA-C 06/24/24 1401    Dorothyann Drivers, MD 06/24/24 [phone]

## 2024-06-24 NOTE — Discharge Instructions (Signed)
 Please take antibiotics as prescribed.  Please follow-up with a dentist.  Please return for any new, worsening, or changing symptoms or other concerns.  It was a pleasure caring for you today.
